# Patient Record
Sex: Male | Born: 1945 | Race: Black or African American | Hispanic: No | Smoking: Current every day smoker
Health system: Southern US, Community
[De-identification: ages and names within clinical notes are randomized; demographics above are authoritative.]

## PROBLEM LIST (undated history)

## (undated) DIAGNOSIS — R159 Full incontinence of feces: Secondary | ICD-10-CM

## (undated) DIAGNOSIS — M1990 Unspecified osteoarthritis, unspecified site: Secondary | ICD-10-CM

## (undated) DIAGNOSIS — I10 Essential (primary) hypertension: Secondary | ICD-10-CM

## (undated) DIAGNOSIS — Z87442 Personal history of urinary calculi: Secondary | ICD-10-CM

## (undated) DIAGNOSIS — I739 Peripheral vascular disease, unspecified: Secondary | ICD-10-CM

## (undated) DIAGNOSIS — C801 Malignant (primary) neoplasm, unspecified: Secondary | ICD-10-CM

---

## 1993-04-06 DIAGNOSIS — Z87828 Personal history of other (healed) physical injury and trauma: Secondary | ICD-10-CM | POA: Insufficient documentation

## 1995-04-07 HISTORY — PX: ABOVE KNEE LEG AMPUTATION: SUR20

## 2002-04-06 DIAGNOSIS — I739 Peripheral vascular disease, unspecified: Secondary | ICD-10-CM | POA: Insufficient documentation

## 2011-05-12 DIAGNOSIS — F17200 Nicotine dependence, unspecified, uncomplicated: Secondary | ICD-10-CM | POA: Insufficient documentation

## 2011-05-12 DIAGNOSIS — I10 Essential (primary) hypertension: Secondary | ICD-10-CM | POA: Insufficient documentation

## 2011-05-12 DIAGNOSIS — E785 Hyperlipidemia, unspecified: Secondary | ICD-10-CM | POA: Insufficient documentation

## 2014-12-18 DIAGNOSIS — Z9189 Other specified personal risk factors, not elsewhere classified: Secondary | ICD-10-CM | POA: Insufficient documentation

## 2015-02-06 DIAGNOSIS — R296 Repeated falls: Secondary | ICD-10-CM | POA: Insufficient documentation

## 2015-02-17 ENCOUNTER — Emergency Department (HOSPITAL_COMMUNITY): Payer: Medicare Other

## 2015-02-17 ENCOUNTER — Encounter (HOSPITAL_COMMUNITY): Payer: Self-pay

## 2015-02-17 ENCOUNTER — Emergency Department (HOSPITAL_COMMUNITY)
Admission: EM | Admit: 2015-02-17 | Discharge: 2015-02-17 | Disposition: A | Discharge status: Home / Self Care | Payer: Medicare Other | Attending: Emergency Medicine | Admitting: Emergency Medicine

## 2015-02-17 ENCOUNTER — Encounter (HOSPITAL_COMMUNITY): Payer: Self-pay | Admitting: *Deleted

## 2015-02-17 ENCOUNTER — Emergency Department (INDEPENDENT_AMBULATORY_CARE_PROVIDER_SITE_OTHER)
Admission: EM | Admit: 2015-02-17 | Discharge: 2015-02-17 | Disposition: A | Discharge status: Other Acute Inpatient Hospital | Payer: Medicare Other | Source: Home / Self Care | Attending: Family Medicine | Admitting: Family Medicine

## 2015-02-17 DIAGNOSIS — Z79899 Other long term (current) drug therapy: Secondary | ICD-10-CM | POA: Insufficient documentation

## 2015-02-17 DIAGNOSIS — I10 Essential (primary) hypertension: Secondary | ICD-10-CM | POA: Insufficient documentation

## 2015-02-17 DIAGNOSIS — M79675 Pain in left toe(s): Secondary | ICD-10-CM | POA: Diagnosis present

## 2015-02-17 DIAGNOSIS — Z7982 Long term (current) use of aspirin: Secondary | ICD-10-CM | POA: Diagnosis not present

## 2015-02-17 DIAGNOSIS — G8929 Other chronic pain: Secondary | ICD-10-CM

## 2015-02-17 DIAGNOSIS — Z880 Allergy status to penicillin: Secondary | ICD-10-CM | POA: Insufficient documentation

## 2015-02-17 DIAGNOSIS — F17200 Nicotine dependence, unspecified, uncomplicated: Secondary | ICD-10-CM | POA: Insufficient documentation

## 2015-02-17 DIAGNOSIS — Z8679 Personal history of other diseases of the circulatory system: Secondary | ICD-10-CM | POA: Diagnosis not present

## 2015-02-17 DIAGNOSIS — M869 Osteomyelitis, unspecified: Secondary | ICD-10-CM | POA: Diagnosis not present

## 2015-02-17 HISTORY — DX: Peripheral vascular disease, unspecified: I73.9

## 2015-02-17 HISTORY — DX: Essential (primary) hypertension: I10

## 2015-02-17 MED ORDER — SULFAMETHOXAZOLE-TRIMETHOPRIM 800-160 MG PO TABS
1.0000 | ORAL_TABLET | Freq: Once | ORAL | Status: AC
  Administered 2015-02-17: 1 via ORAL
  Filled 2015-02-17: qty 1

## 2015-02-17 MED ORDER — RIFAMPIN 300 MG PO CAPS: 300.0000 mg | ORAL_CAPSULE | Freq: Two times a day (BID) | ORAL | Status: DC

## 2015-02-17 MED ORDER — SULFAMETHOXAZOLE-TRIMETHOPRIM 800-160 MG PO TABS: 1.0000 | ORAL_TABLET | Freq: Two times a day (BID) | ORAL | Status: DC

## 2015-02-17 NOTE — ED Provider Notes (Signed)
CSN: [id number]     Arrival date & time 02/17/15  2041 History   First MD Initiated Contact with Patient 02/17/15 2118     Chief Complaint  Patient presents with  . Toe Pain    HPI  Patient presents with concern of left toe pain. Toe has been painful for a long time, worse over the past. Patient saw podiatry a few days ago, had an injection, though he is unsure of what. Subsequent, the patient developed swelling, and eventually discharged from the toe. Prior to my evaluation notes that the toe had production of substantial amounts of pus, spontaneously. Patient went to urgent care tonight, was sent here for evaluation. He denies any other complaints, including fever, chills, lightheadedness, nausea, vomiting, loss of sensation in the foot. He has multiple medical issues, he acknowledges, including hypertension, peripheral vascular disease. He is scheduled to see vascular surgery in 4 days.   Past Medical History  Diagnosis Date  . Hypertension   . Peripheral artery disease (HCC)    History reviewed. No pertinent past surgical history. History reviewed. No pertinent family history. Social History  Substance Use Topics  . Smoking status: Current Every Day Smoker  . Smokeless tobacco: None  . Alcohol Use: Yes    Review of Systems  Constitutional:       Per HPI, otherwise negative  HENT:       Per HPI, otherwise negative  Respiratory:       Per HPI, otherwise negative  Cardiovascular:       Per HPI, otherwise negative  Gastrointestinal: Negative for vomiting.  Endocrine:       Negative aside from HPI  Genitourinary:       Neg aside from HPI   Musculoskeletal:       Per HPI, otherwise negative  Skin: Positive for color change and wound.  Neurological: Negative for syncope.      Allergies  Orange fruit and Penicillins  Home Medications   Prior to Admission medications   Medication Sig Start Date End Date Taking? Authorizing Provider  amLODipine (NORVASC) 5 MG  tablet Take 5 mg by mouth daily.   Yes Historical Provider, MD  aspirin EC 81 MG tablet Take 81 mg by mouth every 14 (fourteen) days.   Yes Historical Provider, MD  atorvastatin (LIPITOR) 40 MG tablet Take 40 mg by mouth daily.   Yes Historical Provider, MD  ibuprofen (ADVIL,MOTRIN) 200 MG tablet Take 200 mg by mouth every 6 (six) hours as needed (pain).   Yes Historical Provider, MD  OVER THE COUNTER MEDICATION Take 1 capsule by mouth daily. Beta Prostate   Yes Historical Provider, MD  sildenafil (VIAGRA) 50 MG tablet Take 50 mg by mouth daily as needed for erectile dysfunction.   Yes Historical Provider, MD  rifampin (RIFADIN) 300 MG capsule Take 1 capsule (300 mg total) by mouth 2 (two) times daily. 02/17/15   Gerhard Munch, MD  sulfamethoxazole-trimethoprim (BACTRIM DS,SEPTRA DS) 800-160 MG tablet Take 1 tablet by mouth 2 (two) times daily. 02/17/15   Gerhard Munch, MD   BP 130/72 mmHg  Pulse 86  Temp(Src) 97.8 F (36.6 C) (Oral)  Resp 16  SpO2 100% Physical Exam  Constitutional: He is oriented to person, place, and time. He appears well-developed. No distress.  HENT:  Head: Normocephalic and atraumatic.  Eyes: Conjunctivae and EOM are normal.  Cardiovascular: Normal rate and regular rhythm.   Palpable posterior tibial, and dorsalis pedis pulses, left foot, Refill is normal  Pulmonary/Chest:  Effort normal. No stridor. No respiratory distress.  Abdominal: He exhibits no distension.  Musculoskeletal: He exhibits no edema.  Neurological: He is alert and oriented to person, place, and time.  Skin: Skin is warm and dry.     Skin is sclerotic, patient notes that this is normal, unchanged.  Psychiatric: He has a normal mood and affect.  Nursing note and vitals reviewed.   ED Course  Procedures (including critical care time) Labs Review Labs Reviewed - No data to display  Imaging Review Dg Toe 5th Left  02/17/2015  CLINICAL DATA:  Pain at the right fifth toe, acute onset.  Initial encounter. EXAM: DG TOE 5TH LEFT COMPARISON:  None. FINDINGS: There is near complete absence of the fifth distal phalanx, suspicious for underlying osteomyelitis. The fifth proximal phalanx is grossly unremarkable, though difficult to fully characterize on radiograph. Surrounding soft tissue swelling is noted. Remaining visualized joint spaces are preserved. IMPRESSION: Near complete absence of the fifth distal phalanx, suspicious for osteomyelitis. The fifth proximal phalanx is grossly unremarkable in appearance, though difficult to fully assess on radiograph. Electronically Signed   By: Roanna RaiderJeffery  Chang M.D.   On: 02/17/2015 22:35   I have personally reviewed and evaluated these images and lab results as part of my medical decision-making.  I also reviewed the patient's notes from urgent care earlier tonight. I had a lengthy conversation patient and multiple family members about his likely infection, need to keep his vascular surgery appointment in 4 days, need for medication compliance.   MDM   Final diagnoses:  Osteomyelitis of toe (HCC)   Patient presents with ongoing pain, swelling in the left toe, fifth. Patient has evidence for chronic infection, no evidence for bacteremia or sepsis. Given the spontaneous drainage, there is low suspicion for deep space infection. Patient started on course of antibiotics, will follow-up as scheduled w vascular surgery.  Gerhard Munchobert Hurshell Dino, MD 02/17/15 [phone]

## 2015-02-17 NOTE — ED Notes (Signed)
Pt  Has  Poor  Circulation    He  Has  An appt in  4  Days  With a  Vascular  Surgeon   At  [facility]        He  Saw a   Podiatrist       3  Days  Ago     And  Had   An  Injection      In  The   l  Small  Toe     He    Reports  Pain   On     Palpation

## 2015-02-17 NOTE — ED Notes (Signed)
Pt off unit with xray 

## 2015-02-17 NOTE — Discharge Instructions (Signed)
As discussed, it is very important that you take all medication as directed, monitor your condition carefully, and be sure to follow-[facility] in 4 days.    Be sure to return here, or to another emergency department if you develop any new, or concerning changes in your condition.

## 2015-02-17 NOTE — ED Provider Notes (Addendum)
CSN: [id number]     Arrival date & time 02/17/15  1920 History   First MD Initiated Contact with Patient 02/17/15 1950     Chief Complaint  Patient presents with  . Toe Pain   (Consider location/radiation/quality/duration/timing/severity/associated sxs/prior Treatment) Patient is a 69 y.o. male presenting with toe pain. The history is provided by the patient and a relative.  Toe Pain This is a recurrent problem. The current episode started more than 1 week ago. The problem has been gradually worsening. Associated symptoms comments: Has had stents in left leg recently for vasc insuff with [facility] eval on fri this week but pt with chronic left foot and 5th toe swelling , recent podiatrist injection but today toe pain and sts spont drained with purulent fluid and sx relief of pain..    Past Medical History  Diagnosis Date  . Hypertension   . Peripheral artery disease (HCC)    History reviewed. No pertinent past surgical history. History reviewed. No pertinent family history. Social History  Substance Use Topics  . Smoking status: Current Every Day Smoker  . Smokeless tobacco: None  . Alcohol Use: Yes    Review of Systems  Constitutional: Negative.   Musculoskeletal: Positive for joint swelling and gait problem.  Skin: Positive for wound.  All other systems reviewed and are negative.   Allergies  Orange fruit and Penicillins  Home Medications   Prior to Admission medications   Medication Sig Start Date End Date Taking? Authorizing Provider  amLODipine (NORVASC) 5 MG tablet Take 5 mg by mouth daily.   Yes Historical Provider, MD  aspirin EC 81 MG tablet Take 81 mg by mouth every 14 (fourteen) days.    Historical Provider, MD  atorvastatin (LIPITOR) 40 MG tablet Take 40 mg by mouth daily.    Historical Provider, MD  ibuprofen (ADVIL,MOTRIN) 200 MG tablet Take 200 mg by mouth every 6 (six) hours as needed (pain).    Historical Provider, MD  OVER THE COUNTER MEDICATION Take 1  capsule by mouth daily. Beta Prostate    Historical Provider, MD  sildenafil (VIAGRA) 50 MG tablet Take 50 mg by mouth daily as needed for erectile dysfunction.    Historical Provider, MD  rifampin (RIFADIN) 300 MG capsule Take 1 capsule (300 mg total) by mouth 2 (two) times daily. 02/17/15   Gerhard Munchobert Lockwood, MD  sulfamethoxazole-trimethoprim (BACTRIM DS,SEPTRA DS) 800-160 MG tablet Take 1 tablet by mouth 2 (two) times daily. 02/17/15   Gerhard Munchobert Lockwood, MD   Meds Ordered and Administered this Visit  Medications - No data to display  BP 134/92 mmHg  Pulse 96  Temp(Src) 98.7 F (37.1 C) (Oral)  SpO2 96% No data found.   Physical Exam  Constitutional: He is oriented to person, place, and time. He appears well-developed and well-nourished. No distress.  Neurological: He is alert and oriented to person, place, and time.  Skin: Skin is warm and dry. There is erythema.  Open draining lesion on tip of left 5th toe, dark and ischemic appearing.  Nursing note and vitals reviewed.   ED Course  Procedures (including critical care time)  Labs Review Labs Reviewed - No data to display  Imaging Review Dg Toe 5th Left  02/17/2015  CLINICAL DATA:  Pain at the right fifth toe, acute onset. Initial encounter. EXAM: DG TOE 5TH LEFT COMPARISON:  None. FINDINGS: There is near complete absence of the fifth distal phalanx, suspicious for underlying osteomyelitis. The fifth proximal phalanx is grossly unremarkable, though difficult  to fully characterize on radiograph. Surrounding soft tissue swelling is noted. Remaining visualized joint spaces are preserved. IMPRESSION: Near complete absence of the fifth distal phalanx, suspicious for osteomyelitis. The fifth proximal phalanx is grossly unremarkable in appearance, though difficult to fully assess on radiograph. Electronically Signed   By: Roanna Raider M.D.   On: 02/17/2015 22:35     Visual Acuity Review  Right Eye Distance:   Left Eye Distance:    Bilateral Distance:    Right Eye Near:   Left Eye Near:    Bilateral Near:         MDM   1. Chronic toe pain, left foot    Sent for likely eval of osteomyelitis left 5th toe.    Linna Hoff, MD 02/17/15 2020  Linna Hoff, MD 02/18/15 [phone]

## 2015-02-17 NOTE — ED Notes (Signed)
To ed from u/c.  Recurrent left small toe pain when touching.  Recent podiatrist injection, pt not sure what was injected.  [facility] stens in left leg.l  Toe drained pus  spontaneously today.

## 2015-12-31 DIAGNOSIS — C61 Malignant neoplasm of prostate: Secondary | ICD-10-CM | POA: Insufficient documentation

## 2016-05-03 IMAGING — DX DG TOE 5TH 2+V*L*
3 series · 3 of 3 positions shown · non-contrast
Comparison: None.

CLINICAL DATA: Pain at the right fifth toe, acute onset. Initial
encounter.

EXAM:
DG TOE 5TH LEFT

[toe ap]
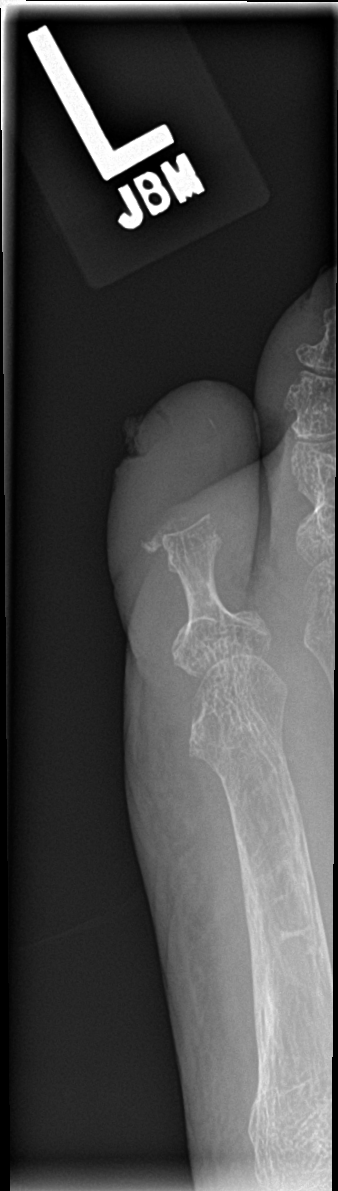

[toe obl]
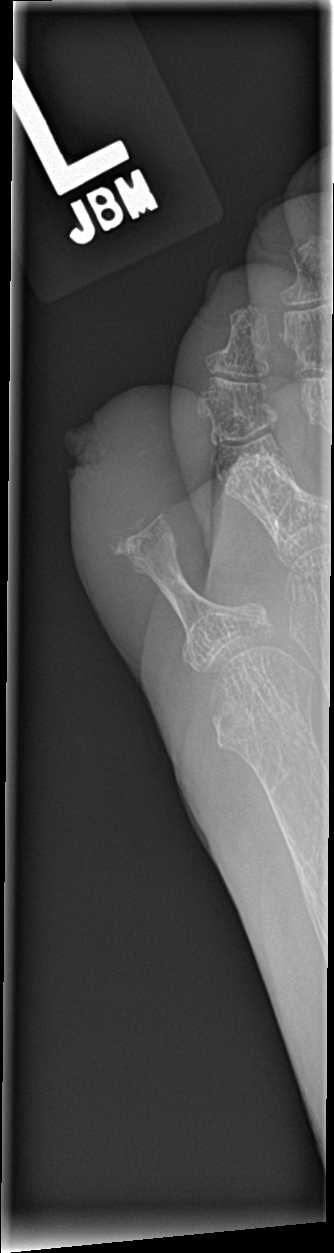

[toe lat]
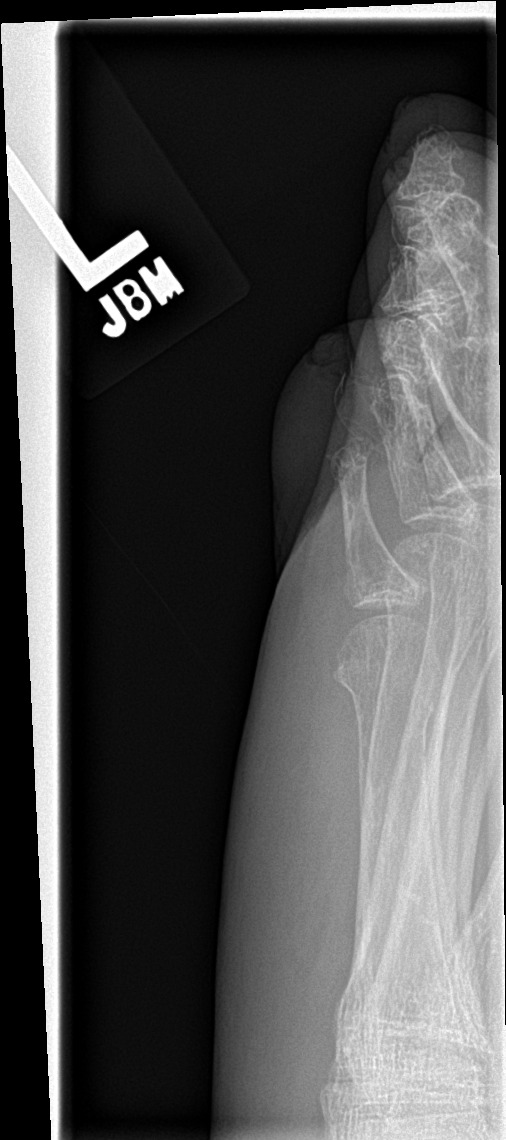

[3 of 3 positions shown; findings below may reference images not displayed]

FINDINGS: There is near complete absence of the fifth distal phalanx,
suspicious for underlying osteomyelitis. The fifth proximal phalanx
is grossly unremarkable, though difficult to fully characterize on
radiograph. Surrounding soft tissue swelling is noted. Remaining
visualized joint spaces are preserved.
IMPRESSION: Near complete absence of the fifth distal phalanx, suspicious for
osteomyelitis. The fifth proximal phalanx is grossly unremarkable in
appearance, though difficult to fully assess on radiograph.

## 2016-09-15 DIAGNOSIS — N521 Erectile dysfunction due to diseases classified elsewhere: Secondary | ICD-10-CM | POA: Insufficient documentation

## 2018-09-23 DIAGNOSIS — Z89612 Acquired absence of left leg above knee: Secondary | ICD-10-CM | POA: Insufficient documentation

## 2021-04-06 HISTORY — PX: ABOVE KNEE LEG AMPUTATION: SUR20

## 2022-03-17 ENCOUNTER — Ambulatory Visit: Payer: Self-pay | Admitting: Family

## 2022-09-10 ENCOUNTER — Ambulatory Visit: Payer: Medicare Other | Admitting: Podiatry

## 2022-10-05 NOTE — H&P (View-Only) (Signed)
 [facility] [place]  ASSESSMENT / PLAN: Steven Sharp is a 77 y.o. male with atherosclerosis of native arteries of right lower extremity causing ulceration.  Patient counseled patients with chronic limb threatening ischemia have an annual risk of cardiovascular mortality of 25% and a high risk of amputation.   WIfI score calculated based on clinical exam and non-invasive measurements. 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  Recommend:  Abstinence from all tobacco products. Blood glucose control with goal A1c < 7%. Blood pressure control with goal blood pressure < 140/90 mmHg. Lipid reduction therapy with goal LDL-C <70 mg/dL.  Aspirin 81mg  PO QD.  Atorvastatin 40-80mg  PO QD (or other "high intensity" statin therapy).  The patient is on best medical therapy for peripheral arterial disease. The patient has been counseled about the risks of tobacco use in atherosclerotic disease. The patient has been counseled to abstain from any tobacco use. An aortogram with bilateral lower extremity runoff angiography and Right lower extremity intervention and is indicated to better evaluate the patient's lower extremity circulation because of the limb threatening nature of the patient's diagnosis. Based on the patient's clinical exam and non-invasive data, we anticipate an endovascular intervention in the iliac and femoropopliteal vessels. Stenting would be favored because of the improved primary patency of these interventions as compared to plain balloon angioplasty.  CHIEF COMPLAINT: right foot ulceration  HISTORY OF PRESENT ILLNESS: Steven Sharp is a 77 y.o. male who presents to clinic for evaluation of right foot ulceration.  He previously establish care with [facility], but this system is out of his insurance network; and so he presents today for evaluation.  He is a complicated past vascular history before mostly at the [facility] [place].  He has a remote history of left  above-knee amputation.  He manages very well with this and transfers with his right lower extremity.  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  He is referred for further management.  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  No family history on file.  Social History   Socioeconomic History   Marital status: Single    Spouse name: Not on file   Number of children: Not on file   Years of education: Not on file   Highest education level: Not on file  Occupational History   Not on file  Tobacco Use   Smoking status: Every Day   Smokeless tobacco: Not on file  Substance and Sexual Activity   Alcohol use: Yes   Drug use: Not on file   Sexual activity: Not on file  Other Topics Concern   Not on file  Social History Narrative   Not on file   Social Determinants of Health   Financial Resource Strain: Not on file  Food Insecurity: Not on file  Transportation Needs: Not on file  Physical Activity: Not on file  Stress: Not on file  Social Connections: Not on file  Intimate Partner Violence: Not on file    Allergies  Allergen Reactions   Orange Fruit [Citrus]  Hives and Itching   Penicillins Hives and Itching    Has patient had a PCN reaction causing immediate rash, facial/tongue/throat swelling, SOB or lightheadedness with hypotension: Yes Has patient had a PCN reaction causing severe rash involving mucus membranes or skin necrosis: No Has patient had a PCN reaction that required hospitalization No Has patient had a PCN reaction occurring within the last  10 years: No If all of the above answers are "NO", then may proceed with Cephalosporin use.    Current Outpatient Medications  Medication Sig Dispense Refill   amLODipine (NORVASC) 5 MG tablet Take 5 mg by mouth daily.     aspirin EC 81 MG tablet Take 81 mg by mouth every 14 (fourteen) days.     atorvastatin (LIPITOR) 40 MG tablet Take 40 mg by mouth daily.     ibuprofen (ADVIL,MOTRIN) 200 MG tablet Take 200 mg by mouth every 6 (six) hours as needed (pain).     OVER THE COUNTER MEDICATION Take 1 capsule by mouth daily. Beta Prostate     rifampin (RIFADIN) 300 MG capsule Take 1 capsule (300 mg total) by mouth 2 (two) times daily. 20 capsule 0   sildenafil (VIAGRA) 50 MG tablet Take 50 mg by mouth daily as needed for erectile dysfunction.     sulfamethoxazole-trimethoprim (BACTRIM DS,SEPTRA DS) 800-160 MG tablet Take 1 tablet by mouth 2 (two) times daily. 20 tablet 0   No current facility-administered medications for this visit.    PHYSICAL EXAM Vitals:   10/06/22 0951  BP: (!) 153/76  Pulse: 88  Resp: 20  Temp: 98.2 F (36.8 C)  SpO2: 93%  Height: 5\' 6"  (1.676 m)   Chronically ill elderly man.  In a wheelchair.  Left above-knee amputation.  Regular rate and rhythm.  Unlabored breathing.  Right second toe ulcer approximately 2 x 2 cm.  No exterior signs of infection.  No palpable pedal pulses.  PERTINENT LABORATORY AND RADIOLOGIC DATA Outside ABI   Ankle Dorsalis Pedis 78 0.48 Monophasic Ankle Posterior Tibial 72 0.44 Monophasic Digit 1 22 0.13 Reduced    Outside duplex Occlusion of the superficial femoral and popliteal arteries. Patent common femoral and profunda arteries by duplex. Fem fem bypass graft occluded per duplex.   Rande Brunt. Lenell Antu, MD FACS [facility] Phone Number: [phone] 10/05/2022 9:23 AM   Total time spent on preparing this encounter including chart review, data review, collecting history, examining the  patient, coordinating care for this new patient, 60 minutes.  Portions of this report may have been transcribed using voice recognition software.  Every effort has been made to ensure accuracy; however, inadvertent computerized transcription errors may still be present.

## 2022-10-05 NOTE — H&P (View-Only) (Signed)
[facility] [place]  ASSESSMENT / PLAN: Steven Sharp is a 77 y.o. male with atherosclerosis of native arteries of right lower extremity causing ulceration.  Patient counseled patients with chronic limb threatening ischemia have an annual risk of cardiovascular mortality of 25% and a high risk of amputation.   WIfI score calculated based on clinical exam and non-invasive measurements. 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  Recommend:  Abstinence from all tobacco products. Blood glucose control with goal A1c < 7%. Blood pressure control with goal blood pressure < 140/90 mmHg. Lipid reduction therapy with goal LDL-C <70 mg/dL.  Aspirin 81mg  PO QD.  Atorvastatin 40-80mg  PO QD (or other "high intensity" statin therapy).  The patient is on best medical therapy for peripheral arterial disease. The patient has been counseled about the risks of tobacco use in atherosclerotic disease. The patient has been counseled to abstain from any tobacco use. An aortogram with bilateral lower extremity runoff angiography and Right lower extremity intervention and is indicated to better evaluate the patient's lower extremity circulation because of the limb threatening nature of the patient's diagnosis. Based on the patient's clinical exam and non-invasive data, we anticipate an endovascular intervention in the iliac and femoropopliteal vessels. Stenting would be favored because of the improved primary patency of these interventions as compared to plain balloon angioplasty.  CHIEF COMPLAINT: right foot ulceration  HISTORY OF PRESENT ILLNESS: Steven Sharp is a 77 y.o. male who presents to clinic for evaluation of right foot ulceration.  He previously establish care with [facility], but this system is out of his insurance network; and so he presents today for evaluation.  He is a complicated past vascular history before mostly at the [facility] [place].  He has a remote history of left  above-knee amputation.  He manages very well with this and transfers with his right lower extremity.  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  He is referred for further management.  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  No family history on file.  Social History   Socioeconomic History   Marital status: Single    Spouse name: Not on file   Number of children: Not on file   Years of education: Not on file   Highest education level: Not on file  Occupational History   Not on file  Tobacco Use   Smoking status: Every Day   Smokeless tobacco: Not on file  Substance and Sexual Activity   Alcohol use: Yes   Drug use: Not on file   Sexual activity: Not on file  Other Topics Concern   Not on file  Social History Narrative   Not on file   Social Determinants of Health   Financial Resource Strain: Not on file  Food Insecurity: Not on file  Transportation Needs: Not on file  Physical Activity: Not on file  Stress: Not on file  Social Connections: Not on file  Intimate Partner Violence: Not on file    Allergies  Allergen Reactions   Orange Fruit [Citrus]  Hives and Itching   Penicillins Hives and Itching    Has patient had a PCN reaction causing immediate rash, facial/tongue/throat swelling, SOB or lightheadedness with hypotension: Yes Has patient had a PCN reaction causing severe rash involving mucus membranes or skin necrosis: No Has patient had a PCN reaction that required hospitalization No Has patient had a PCN reaction occurring within the last  10 years: No If all of the above answers are "NO", then may proceed with Cephalosporin use.    Current Outpatient Medications  Medication Sig Dispense Refill   amLODipine (NORVASC) 5 MG tablet Take 5 mg by mouth daily.     aspirin EC 81 MG tablet Take 81 mg by mouth every 14 (fourteen) days.     atorvastatin (LIPITOR) 40 MG tablet Take 40 mg by mouth daily.     ibuprofen (ADVIL,MOTRIN) 200 MG tablet Take 200 mg by mouth every 6 (six) hours as needed (pain).     OVER THE COUNTER MEDICATION Take 1 capsule by mouth daily. Beta Prostate     rifampin (RIFADIN) 300 MG capsule Take 1 capsule (300 mg total) by mouth 2 (two) times daily. 20 capsule 0   sildenafil (VIAGRA) 50 MG tablet Take 50 mg by mouth daily as needed for erectile dysfunction.     sulfamethoxazole-trimethoprim (BACTRIM DS,SEPTRA DS) 800-160 MG tablet Take 1 tablet by mouth 2 (two) times daily. 20 tablet 0   No current facility-administered medications for this visit.    PHYSICAL EXAM Vitals:   10/06/22 0951  BP: (!) 153/76  Pulse: 88  Resp: 20  Temp: 98.2 F (36.8 C)  SpO2: 93%  Height: 5\' 6"  (1.676 m)   Chronically ill elderly man.  In a wheelchair.  Left above-knee amputation.  Regular rate and rhythm.  Unlabored breathing.  Right second toe ulcer approximately 2 x 2 cm.  No exterior signs of infection.  No palpable pedal pulses.  PERTINENT LABORATORY AND RADIOLOGIC DATA Outside ABI   Ankle Dorsalis Pedis 78 0.48 Monophasic Ankle Posterior Tibial 72 0.44 Monophasic Digit 1 22 0.13 Reduced    Outside duplex Occlusion of the superficial femoral and popliteal arteries. Patent common femoral and profunda arteries by duplex. Fem fem bypass graft occluded per duplex.   Rande Brunt. Lenell Antu, MD FACS [facility] Phone Number: [phone] 10/05/2022 9:23 AM   Total time spent on preparing this encounter including chart review, data review, collecting history, examining the  patient, coordinating care for this new patient, 60 minutes.  Portions of this report may have been transcribed using voice recognition software.  Every effort has been made to ensure accuracy; however, inadvertent computerized transcription errors may still be present.

## 2022-10-05 NOTE — Progress Notes (Unsigned)
VASC[facility]IN SPECIALISTS OF [place]  ASSESSMENT / PLAN: Steven Sharp is a 77 y.o. male with atherosclerosis of native arteries of right lower extremity causing ulceration.  Patient counseled patients with chronic limb threatening ischemia have an annual risk of cardiovascular mortality of 25% and a high risk of amputation.   WIfI score calculated based on clinical exam and non-invasive measurements. 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  Recommend:  Abstinence from all tobacco products. Blood glucose control with goal A1c < 7%. Blood pressure control with goal blood pressure < 140/90 mmHg. Lipid reduction therapy with goal LDL-C <70 mg/dL.  Aspirin 81mg  PO QD.  Atorvastatin 40-80mg  PO QD (or other "high intensity" statin therapy).  The patient is on best medical therapy for peripheral arterial disease. The patient has been counseled about the risks of tobacco use in atherosclerotic disease. The patient has been counseled to abstain from any tobacco use. An aortogram with bilateral lower extremity runoff angiography and Right lower extremity intervention and is indicated to better evaluate the patient's lower extremity circulation because of the limb threatening nature of the patient's diagnosis. Based on the patient's clinical exam and non-invasive data, we anticipate an endovascular intervention in the iliac and femoropopliteal vessels. Stenting would be favored because of the improved primary patency of these interventions as compared to plain balloon angioplasty.  CHIEF COMPLAINT: right foot ulceration  HISTORY OF PRESENT ILLNESS: Steven Sharp is a 77 y.o. male who presents to clinic for evaluation of right foot ulceration.  He previously establish care with [facility], but this system is out of his insurance network; and so he presents today for evaluation.  He is a complicated past vascular history before mostly at the [place] [facility] [place].  He has a remote history of left  above-knee amputation.  He manages very well with this and transfers with his right lower extremity.  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  He is referred for further management.  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  No family history on file.  Social History   Socioeconomic History   Marital status: Single    Spouse name: Not on file   Number of children: Not on file   Years of education: Not on file   Highest education level: Not on file  Occupational History   Not on file  Tobacco Use   Smoking status: Every Day   Smokeless tobacco: Not on file  Substance and Sexual Activity   Alcohol use: Yes   Drug use: Not on file   Sexual activity: Not on file  Other Topics Concern   Not on file  Social History Narrative   Not on file   Social Determinants of Health   Financial Resource Strain: Not on file  Food Insecurity: Not on file  Transportation Needs: Not on file  Physical Activity: Not on file  Stress: Not on file  Social Connections: Not on file  Intimate Partner Violence: Not on file    Allergies  Allergen Reactions   Orange Fruit [Citrus]  Hives and Itching   Penicillins Hives and Itching    Has patient had a PCN reaction causing immediate rash, facial/tongue/throat swelling, SOB or lightheadedness with hypotension: Yes Has patient had a PCN reaction causing severe rash involving mucus membranes or skin necrosis: No Has patient had a PCN reaction that required hospitalization No Has patient had a PCN reaction occurring within the last  10 years: No If all of the above answers are "NO", then may proceed with Cephalosporin use.    Current Outpatient Medications  Medication Sig Dispense Refill   amLODipine (NORVASC) 5 MG tablet Take 5 mg by mouth daily.     aspirin EC 81 MG tablet Take 81 mg by mouth every 14 (fourteen) days.     atorvastatin (LIPITOR) 40 MG tablet Take 40 mg by mouth daily.     ibuprofen (ADVIL,MOTRIN) 200 MG tablet Take 200 mg by mouth every 6 (six) hours as needed (pain).     OVER THE COUNTER MEDICATION Take 1 capsule by mouth daily. Beta Prostate     rifampin (RIFADIN) 300 MG capsule Take 1 capsule (300 mg total) by mouth 2 (two) times daily. 20 capsule 0   sildenafil (VIAGRA) 50 MG tablet Take 50 mg by mouth daily as needed for erectile dysfunction.     sulfamethoxazole-trimethoprim (BACTRIM DS,SEPTRA DS) 800-160 MG tablet Take 1 tablet by mouth 2 (two) times daily. 20 tablet 0   No current facility-administered medications for this visit.    PHYSICAL EXAM Vitals:   10/06/22 0951  BP: (!) 153/76  Pulse: 88  Resp: 20  Temp: 98.2 F (36.8 C)  SpO2: 93%  Height: 5\' 6"  (1.676 m)   Chronically ill elderly man.  In a wheelchair.  Left above-knee amputation.  Regular rate and rhythm.  Unlabored breathing.  Right second toe ulcer approximately 2 x 2 cm.  No exterior signs of infection.  No palpable pedal pulses.  PERTINENT LABORATORY AND RADIOLOGIC DATA Outside ABI   Ankle Dorsalis Pedis 78 0.48 Monophasic Ankle Posterior Tibial 72 0.44 Monophasic Digit 1 22 0.13 Reduced    Outside duplex Occlusion of the superficial femoral and popliteal arteries. Patent common femoral and profunda arteries by duplex. Fem fem bypass graft occluded per duplex.   Rande Brunt. Lenell Antu, MD FACS [facility] of [facility] Phone Number: [phone] 10/05/2022 9:23 AM   Total time spent on preparing this encounter including chart review, data review, collecting history, examining the  patient, coordinating care for this new patient, 60 minutes.  Portions of this report may have been transcribed using voice recognition software.  Every effort has been made to ensure accuracy; however, inadvertent computerized transcription errors may still be present.

## 2022-10-06 ENCOUNTER — Ambulatory Visit (INDEPENDENT_AMBULATORY_CARE_PROVIDER_SITE_OTHER): Payer: Medicare (Managed Care) | Admitting: Vascular Surgery

## 2022-10-06 ENCOUNTER — Encounter: Payer: Self-pay | Admitting: Vascular Surgery

## 2022-10-06 VITALS — BP 153/76 | HR 88 | Temp 98.2°F | Resp 20 | Ht 66.0 in

## 2022-10-06 DIAGNOSIS — I70235 Atherosclerosis of native arteries of right leg with ulceration of other part of foot: Secondary | ICD-10-CM | POA: Diagnosis not present

## 2022-10-09 ENCOUNTER — Telehealth: Payer: Self-pay

## 2022-10-09 NOTE — Telephone Encounter (Signed)
Left VM for Steven Sharp at [facility], to schedule patient for aortogram.

## 2022-10-12 NOTE — Telephone Encounter (Addendum)
Left VM for Jessica at [facility] to return call.

## 2022-10-13 ENCOUNTER — Other Ambulatory Visit: Payer: Self-pay

## 2022-10-13 DIAGNOSIS — I70235 Atherosclerosis of native arteries of right leg with ulceration of other part of foot: Secondary | ICD-10-CM

## 2022-10-23 ENCOUNTER — Ambulatory Visit (HOSPITAL_BASED_OUTPATIENT_CLINIC_OR_DEPARTMENT_OTHER): Payer: Medicare (Managed Care)

## 2022-10-23 ENCOUNTER — Ambulatory Visit (HOSPITAL_COMMUNITY)
Admission: RE | Admit: 2022-10-23 | Discharge: 2022-10-23 | Disposition: A | Discharge status: Home / Self Care | Payer: Medicare (Managed Care) | Attending: Vascular Surgery | Admitting: Vascular Surgery

## 2022-10-23 ENCOUNTER — Ambulatory Visit (HOSPITAL_COMMUNITY)
Admission: RE | Disposition: A | Discharge status: Home / Self Care | Payer: Self-pay | Source: Home / Self Care | Attending: Vascular Surgery

## 2022-10-23 ENCOUNTER — Other Ambulatory Visit: Payer: Self-pay

## 2022-10-23 DIAGNOSIS — I70235 Atherosclerosis of native arteries of right leg with ulceration of other part of foot: Secondary | ICD-10-CM | POA: Diagnosis not present

## 2022-10-23 DIAGNOSIS — L97509 Non-pressure chronic ulcer of other part of unspecified foot with unspecified severity: Secondary | ICD-10-CM | POA: Diagnosis not present

## 2022-10-23 DIAGNOSIS — Z89612 Acquired absence of left leg above knee: Secondary | ICD-10-CM | POA: Insufficient documentation

## 2022-10-23 DIAGNOSIS — L97519 Non-pressure chronic ulcer of other part of right foot with unspecified severity: Secondary | ICD-10-CM | POA: Diagnosis not present

## 2022-10-23 HISTORY — PX: ABDOMINAL AORTOGRAM W/LOWER EXTREMITY: CATH118223

## 2022-10-23 LAB — POCT I-STAT, CHEM 8
BUN: 12 mg/dL (ref 8–23)
Calcium, Ion: 1.15 mmol/L (ref 1.15–1.40)
Chloride: 99 mmol/L (ref 98–111)
Creatinine, Ser: 0.9 mg/dL (ref 0.61–1.24)
Glucose, Bld: 91 mg/dL (ref 70–99)
HCT: 39 % (ref 39.0–52.0)
Hemoglobin: 13.3 g/dL (ref 13.0–17.0)
Potassium: 4.1 mmol/L (ref 3.5–5.1)
Sodium: 134 mmol/L — ABNORMAL LOW (ref 135–145)
TCO2: 26 mmol/L (ref 22–32)

## 2022-10-23 LAB — GLUCOSE, CAPILLARY: Glucose-Capillary: 96 mg/dL (ref 70–99)

## 2022-10-23 SURGERY — ABDOMINAL AORTOGRAM W/LOWER EXTREMITY
Anesthesia: LOCAL

## 2022-10-23 MED ORDER — ACETAMINOPHEN 325 MG PO TABS: 650.0000 mg | ORAL_TABLET | ORAL | Status: DC | PRN

## 2022-10-23 MED ORDER — PROTAMINE SULFATE 10 MG/ML IV SOLN
INTRAVENOUS | Status: AC
  Filled 2022-10-23: qty 5

## 2022-10-23 MED ORDER — CLOPIDOGREL BISULFATE 300 MG PO TABS
ORAL_TABLET | ORAL | Status: AC
  Filled 2022-10-23: qty 1

## 2022-10-23 MED ORDER — HEPARIN (PORCINE) IN NACL 1000-0.9 UT/500ML-% IV SOLN
INTRAVENOUS | Status: DC | PRN
  Administered 2022-10-23 (×2): 500 mL

## 2022-10-23 MED ORDER — SODIUM CHLORIDE 0.9% FLUSH: 3.0000 mL | INTRAVENOUS | Status: DC | PRN

## 2022-10-23 MED ORDER — FENTANYL CITRATE (PF) 100 MCG/2ML IJ SOLN
INTRAMUSCULAR | Status: AC
  Filled 2022-10-23: qty 2

## 2022-10-23 MED ORDER — SODIUM CHLORIDE 0.9 % IV SOLN: 250.0000 mL | INTRAVENOUS | Status: DC | PRN

## 2022-10-23 MED ORDER — MIDAZOLAM HCL 2 MG/2ML IJ SOLN
INTRAMUSCULAR | Status: AC
  Filled 2022-10-23: qty 2

## 2022-10-23 MED ORDER — ONDANSETRON HCL 4 MG/2ML IJ SOLN: 4.0000 mg | Freq: Four times a day (QID) | INTRAMUSCULAR | Status: DC | PRN

## 2022-10-23 MED ORDER — SODIUM CHLORIDE 0.9% FLUSH: 3.0000 mL | Freq: Two times a day (BID) | INTRAVENOUS | Status: DC

## 2022-10-23 MED ORDER — SODIUM CHLORIDE 0.9 % WEIGHT BASED INFUSION: 1.0000 mL/kg/h | INTRAVENOUS | Status: DC

## 2022-10-23 MED ORDER — HEPARIN SODIUM (PORCINE) 1000 UNIT/ML IJ SOLN
INTRAMUSCULAR | Status: AC
  Filled 2022-10-23: qty 10

## 2022-10-23 MED ORDER — LIDOCAINE HCL (PF) 1 % IJ SOLN
INTRAMUSCULAR | Status: AC
  Filled 2022-10-23: qty 30

## 2022-10-23 MED ORDER — SODIUM CHLORIDE 0.9 % IV SOLN: INTRAVENOUS | Status: DC

## 2022-10-23 MED ORDER — IODIXANOL 320 MG/ML IV SOLN
INTRAVENOUS | Status: DC | PRN
  Administered 2022-10-23: 30 mL via INTRA_ARTERIAL

## 2022-10-23 MED ORDER — LIDOCAINE HCL (PF) 1 % IJ SOLN
INTRAMUSCULAR | Status: DC | PRN
  Administered 2022-10-23: 10 mL
  Administered 2022-10-23: 15 mL

## 2022-10-23 MED ORDER — LABETALOL HCL 5 MG/ML IV SOLN: 10.0000 mg | INTRAVENOUS | Status: DC | PRN

## 2022-10-23 MED ORDER — ASPIRIN 81 MG PO CHEW
CHEWABLE_TABLET | ORAL | Status: AC
  Filled 2022-10-23: qty 1

## 2022-10-23 MED ORDER — HYDRALAZINE HCL 20 MG/ML IJ SOLN: 5.0000 mg | INTRAMUSCULAR | Status: DC | PRN

## 2022-10-23 SURGICAL SUPPLY — 17 items
CATH OMNI FLUSH 5F 65CM (CATHETERS) IMPLANT
COVER DOME SNAP 22 D (MISCELLANEOUS) IMPLANT
KIT MICROPUNCTURE NIT STIFF (SHEATH) IMPLANT
KIT PV (KITS) ×1 IMPLANT
KIT SINGLE USE MANIFOLD (KITS) IMPLANT
KIT SYRINGE INJ CVI SPIKEX1 (MISCELLANEOUS) IMPLANT
PROTECTION STATION PRESSURIZED (MISCELLANEOUS) ×1
SET ATX-X65L (MISCELLANEOUS) IMPLANT
SHEATH PINNACLE 5F 10CM (SHEATH) IMPLANT
SHEATH PROBE COVER 6X72 (BAG) IMPLANT
STATION PROTECTION PRESSURIZED (MISCELLANEOUS) IMPLANT
STOPCOCK MORSE 400PSI 3WAY (MISCELLANEOUS) IMPLANT
SYR MEDRAD MARK 7 150ML (SYRINGE) ×1 IMPLANT
TRANSDUCER W/STOPCOCK (MISCELLANEOUS) ×1 IMPLANT
TRAY PV CATH (CUSTOM PROCEDURE TRAY) ×1 IMPLANT
TUBING CIL FLEX 10 FLL-RA (TUBING) IMPLANT
WIRE BENTSON .035X145CM (WIRE) IMPLANT

## 2022-10-23 NOTE — Progress Notes (Signed)
Site area: right groin a 5 french arterial sheath was removed  Site Prior to Removal:  Level 0  Pressure Applied For 20 MINUTES    Bedrest Beginning at 1315pm  Manual:   Yes.    Patient Status During Pull:  stable  Post Pull Groin Site:  Level 0  Post Pull Instructions Given:  Yes.    Post Pull Pulses Present:  Yes.    Dressing Applied:  Yes.    Comments:

## 2022-10-23 NOTE — Progress Notes (Signed)
While dressing pt for DC he complained of smelling bad. I informed him that we did full peri care front and back to place a male pure wick and at that time he had stool on his bottom, pt was cleaned by Marcelino Duster CNA and myself.. He said his daughter is in the health field and will be upset we left him smelling bad. I asked the pt if he would like a wash clothe he said yes, Maureen Ralphs RN washed his back and he was satisfied. His odor has been intense throughout his stay, the bed is black with dirt and flaking skin, when I removed the IV the tegaderm was black with dirt. The nurse/ Tanya RN inserting the Iv stated she had to wash his arm 4-5 times before inserting IV due to dirt.  Pt has been agitated and  demanding most of his stay. Peri area and buttock had mostly healed skin with a white salve residue intact. He stated his buttock is sore and skin is getting ready to open again, pt was handled with as much care, dignity and cleanliness as time allowed. He was in need of a full bath.

## 2022-10-23 NOTE — Op Note (Signed)
DATE OF SERVICE: 10/23/2022  PATIENT:  Steven Sharp  77 y.o. male  PRE-OPERATIVE DIAGNOSIS:  Atherosclerosis of native arteries of right lower extremity causing ulceration  POST-OPERATIVE DIAGNOSIS:  Same  PROCEDURE:   1) Ultrasound guided right common femoral artery access 2) Right lower extremity angiogram   SURGEON:  Rande Brunt. Lenell Antu, MD  ASSISTANT: none  ANESTHESIA:   local  ESTIMATED BLOOD LOSS: minimal  LOCAL MEDICATIONS USED:  LIDOCAINE   COUNTS: confirmed correct.  PATIENT DISPOSITION:  PACU - hemodynamically stable.   Delay start of Pharmacological VTE agent (>24hrs) due to surgical blood loss or risk of bleeding: no  INDICATION FOR PROCEDURE: Steven Sharp is a 77 y.o. male with right foot ulceration. After careful discussion of risks, benefits, and alternatives the patient was offered angiography. The patient understood and wished to proceed.  OPERATIVE FINDINGS:   Right lower extremity:  Common iliac artery: stented artery patent to terminal aorta External iliac artery: patent Common femoral artery: patent  Profunda femoris artery: patent  Superficial femoral artery: occluded Popliteal artery: reconstitutes above the knee, but is diseased. Behind knee segment normal. Below knee popliteal artery appears healthy. Anterior tibial artery: dominant tibial, flows to foot Tibioperoneal trunk: patent Peroneal artery: patent to ankle Posterior tibial artery: occluded Pedal circulation: disadvantaged. Fills via AT.  DESCRIPTION OF PROCEDURE: After identification of the patient in the pre-operative holding area, the patient was transferred to the operating room. The patient was positioned supine on the operating room table. The groins was prepped and draped in standard fashion. A surgical pause was performed confirming correct patient, procedure, and operative location.  The right groin was anesthetized with subcutaneous injection of 1% lidocaine. Using  ultrasound guidance, the right common femoral artery was accessed with micropuncture technique. Fluoroscopy was used to confirm cannulation over the femoral head. Angiography via the sheath was performed. See above for details. The case was ended here. The sheath was left in place to be removed in recovery.  Upon completion of the case instrument and sharps counts were confirmed correct. The patient was transferred to the  PACU in good condition. I was present for all portions of the procedure.  PLAN: needs R CFA - BKPA bypass. Will check vein mapping.   Rande Brunt. Lenell Antu, MD [facility]in Specialists of [facility] Phone Number: [phone] 10/23/2022 12:58 PM

## 2022-10-23 NOTE — Progress Notes (Signed)
Right lower extremity saphenous vein mapping has been completed. Preliminary results can be found in CV Proc through chart review.   10/23/22 3:31 PM Olen Cordial RVT

## 2022-10-23 NOTE — Interval H&P Note (Signed)
History and Physical Interval Note:  10/23/2022 12:04 PM  Steven Sharp  has presented today for surgery, with the diagnosis of pad with right second toe ulcer.  The various methods of treatment have been discussed with the patient and family. After consideration of risks, benefits and other options for treatment, the patient has consented to  Procedure(s): ABDOMINAL AORTOGRAM W/LOWER EXTREMITY (N/A) as a surgical intervention.  The patient's history has been reviewed, patient examined, no change in status, stable for surgery.  I have reviewed the patient's chart and labs.  Questions were answered to the patient's satisfaction.     Leonie Douglas

## 2022-10-26 ENCOUNTER — Encounter (HOSPITAL_COMMUNITY): Payer: Self-pay | Admitting: Vascular Surgery

## 2022-10-27 MED FILL — Clopidogrel Bisulfate Tab 300 MG (Base Equiv): ORAL | Qty: 1 | Status: AC

## 2022-10-27 MED FILL — Protamine Sulfate Inj 10 MG/ML: INTRAVENOUS | Qty: 5 | Status: AC

## 2022-10-27 MED FILL — Heparin Sodium (Porcine) Inj 1000 Unit/ML: INTRAMUSCULAR | Qty: 10 | Status: AC

## 2022-10-28 ENCOUNTER — Other Ambulatory Visit: Payer: Self-pay

## 2022-10-28 DIAGNOSIS — I70235 Atherosclerosis of native arteries of right leg with ulceration of other part of foot: Secondary | ICD-10-CM

## 2022-10-28 DIAGNOSIS — I998 Other disorder of circulatory system: Secondary | ICD-10-CM

## 2022-11-02 ENCOUNTER — Other Ambulatory Visit: Payer: Self-pay

## 2022-11-02 ENCOUNTER — Encounter (HOSPITAL_COMMUNITY): Payer: Self-pay | Admitting: Vascular Surgery

## 2022-11-02 NOTE — Progress Notes (Signed)
I called Mr. Chromy cell phone, patient's daughter, Trey Sailors  answered, she said that she is his daughter and everyone has been talking to her. I explained to daughter that I need to ask Mr. Crowder for permission to speak with her and make sure we have history and give instructions, if Mr. Mcgonigle would like me to speak with her, I will call her back. I called Mr. Leedom, he said that he can speak with me. Mr. Mcwilliams denies chest pain or shortness of breath. Patient denies having any s/s of Covid in his household, also denies any known exposure to Covid. Mr.Gott denies  any s/s of upper or lower respiratory in the past 8 weeks.   Mr. PCP is with [facility] [place] [facility] [place].

## 2022-11-03 NOTE — Anesthesia Preprocedure Evaluation (Signed)
Anesthesia Evaluation  Patient identified by MRN, date of birth, ID band Patient awake    Reviewed: Allergy & Precautions, NPO status , Patient's Chart, lab work & pertinent test results  Airway Mallampati: II  TM Distance: >3 FB Neck ROM: Full    Dental  (+) Dental Advisory Given   Pulmonary Current Smoker and Patient abstained from smoking.   Pulmonary exam normal        Cardiovascular hypertension, Pt. on medications + Peripheral Vascular Disease  Normal cardiovascular exam     Neuro/Psych negative neurological ROS     GI/Hepatic negative GI ROS, Neg liver ROS,,,  Endo/Other  negative endocrine ROS    Renal/GU negative Renal ROS     Musculoskeletal negative musculoskeletal ROS (+)    Abdominal   Peds  Hematology negative hematology ROS (+)   Anesthesia Other Findings   Reproductive/Obstetrics                              Anesthesia Physical Anesthesia Plan  ASA: 3  Anesthesia Plan: General   Post-op Pain Management: Tylenol PO (pre-op)* and Toradol IV (intra-op)*   Induction: Intravenous  PONV Risk Score and Plan: 2 and Ondansetron and Dexamethasone  Airway Management Planned: Oral ETT  Additional Equipment:   Intra-op Plan:   Post-operative Plan: Extubation in OR  Informed Consent: I have reviewed the patients History and Physical, chart, labs and discussed the procedure including the risks, benefits and alternatives for the proposed anesthesia with the patient or authorized representative who has indicated his/her understanding and acceptance.     Dental advisory given  Plan Discussed with: Anesthesiologist, CRNA and Surgeon  Anesthesia Plan Comments:         Anesthesia Quick Evaluation

## 2022-11-04 ENCOUNTER — Inpatient Hospital Stay (HOSPITAL_COMMUNITY): Payer: Medicare (Managed Care) | Admitting: Certified Registered Nurse Anesthetist

## 2022-11-04 ENCOUNTER — Encounter (HOSPITAL_COMMUNITY): Payer: Self-pay | Admitting: Vascular Surgery

## 2022-11-04 ENCOUNTER — Encounter (HOSPITAL_COMMUNITY)
Admission: RE | Disposition: A | Discharge status: Skilled Nursing Facility | Payer: Self-pay | Source: Home / Self Care | Attending: Vascular Surgery

## 2022-11-04 ENCOUNTER — Inpatient Hospital Stay (HOSPITAL_COMMUNITY)
Admission: RE | Admit: 2022-11-04 | Discharge: 2022-11-12 | DRG: 253 | Disposition: A | Discharge status: Skilled Nursing Facility | Payer: Medicare (Managed Care) | Attending: Vascular Surgery | Admitting: Vascular Surgery

## 2022-11-04 ENCOUNTER — Other Ambulatory Visit: Payer: Self-pay

## 2022-11-04 DIAGNOSIS — I10 Essential (primary) hypertension: Secondary | ICD-10-CM | POA: Diagnosis present

## 2022-11-04 DIAGNOSIS — L97511 Non-pressure chronic ulcer of other part of right foot limited to breakdown of skin: Secondary | ICD-10-CM | POA: Diagnosis present

## 2022-11-04 DIAGNOSIS — Z880 Allergy status to penicillin: Secondary | ICD-10-CM | POA: Diagnosis not present

## 2022-11-04 DIAGNOSIS — L97519 Non-pressure chronic ulcer of other part of right foot with unspecified severity: Secondary | ICD-10-CM

## 2022-11-04 DIAGNOSIS — Z95828 Presence of other vascular implants and grafts: Secondary | ICD-10-CM

## 2022-11-04 DIAGNOSIS — I82511 Chronic embolism and thrombosis of right femoral vein: Secondary | ICD-10-CM | POA: Diagnosis present

## 2022-11-04 DIAGNOSIS — I998 Other disorder of circulatory system: Secondary | ICD-10-CM

## 2022-11-04 DIAGNOSIS — F17200 Nicotine dependence, unspecified, uncomplicated: Secondary | ICD-10-CM | POA: Diagnosis present

## 2022-11-04 DIAGNOSIS — I7092 Chronic total occlusion of artery of the extremities: Secondary | ICD-10-CM | POA: Diagnosis not present

## 2022-11-04 DIAGNOSIS — Z79899 Other long term (current) drug therapy: Secondary | ICD-10-CM

## 2022-11-04 DIAGNOSIS — I70235 Atherosclerosis of native arteries of right leg with ulceration of other part of foot: Principal | ICD-10-CM | POA: Diagnosis present

## 2022-11-04 DIAGNOSIS — T8131XA Disruption of external operation (surgical) wound, not elsewhere classified, initial encounter: Secondary | ICD-10-CM | POA: Diagnosis not present

## 2022-11-04 DIAGNOSIS — Z91018 Allergy to other foods: Secondary | ICD-10-CM

## 2022-11-04 DIAGNOSIS — Z7982 Long term (current) use of aspirin: Secondary | ICD-10-CM | POA: Diagnosis not present

## 2022-11-04 DIAGNOSIS — Z993 Dependence on wheelchair: Secondary | ICD-10-CM

## 2022-11-04 DIAGNOSIS — Z89612 Acquired absence of left leg above knee: Secondary | ICD-10-CM

## 2022-11-04 HISTORY — DX: Full incontinence of feces: R15.9

## 2022-11-04 HISTORY — DX: Personal history of urinary calculi: Z87.442

## 2022-11-04 HISTORY — DX: Unspecified osteoarthritis, unspecified site: M19.90

## 2022-11-04 HISTORY — DX: Malignant (primary) neoplasm, unspecified: C80.1

## 2022-11-04 HISTORY — PX: FEMORAL-POPLITEAL BYPASS GRAFT: SHX937

## 2022-11-04 LAB — CBC
HCT: 40.4 % (ref 39.0–52.0)
Hemoglobin: 13.5 g/dL (ref 13.0–17.0)
MCH: 28.7 pg (ref 26.0–34.0)
MCHC: 33.4 g/dL (ref 30.0–36.0)
MCV: 86 fL (ref 80.0–100.0)
Platelets: 199 10*3/uL (ref 150–400)
RBC: 4.7 MIL/uL (ref 4.22–5.81)
RDW: 14.1 % (ref 11.5–15.5)
WBC: 7.5 10*3/uL (ref 4.0–10.5)
nRBC: 0 % (ref 0.0–0.2)

## 2022-11-04 LAB — TYPE AND SCREEN
ABO/RH(D): A POS
Antibody Screen: NEGATIVE

## 2022-11-04 LAB — COMPREHENSIVE METABOLIC PANEL
ALT: 23 U/L (ref 0–44)
AST: 25 U/L (ref 15–41)
Albumin: 3.4 g/dL — ABNORMAL LOW (ref 3.5–5.0)
Alkaline Phosphatase: 86 U/L (ref 38–126)
Anion gap: 12 (ref 5–15)
BUN: 11 mg/dL (ref 8–23)
CO2: 23 mmol/L (ref 22–32)
Calcium: 9.1 mg/dL (ref 8.9–10.3)
Chloride: 99 mmol/L (ref 98–111)
Creatinine, Ser: 0.86 mg/dL (ref 0.61–1.24)
GFR, Estimated: >60 mL/min (ref >60)
Glucose, Bld: 102 mg/dL — ABNORMAL HIGH (ref 70–99)
Potassium: 4 mmol/L (ref 3.5–5.1)
Sodium: 134 mmol/L — ABNORMAL LOW (ref 135–145)
Total Bilirubin: 0.3 mg/dL (ref 0.3–1.2)
Total Protein: 7.5 g/dL (ref 6.5–8.1)

## 2022-11-04 LAB — ABO/RH: ABO/RH(D): A POS

## 2022-11-04 LAB — APTT: aPTT: 33 seconds (ref 24–36)

## 2022-11-04 LAB — PROTIME-INR
INR: 1 (ref 0.8–1.2)
Prothrombin Time: 13.7 seconds (ref 11.4–15.2)

## 2022-11-04 LAB — SURGICAL PCR SCREEN
MRSA, PCR: NEGATIVE
Staphylococcus aureus: POSITIVE — AB

## 2022-11-04 SURGERY — BYPASS GRAFT FEMORAL-POPLITEAL ARTERY
Anesthesia: General | Laterality: Right

## 2022-11-04 MED ORDER — PROPOFOL 10 MG/ML IV BOLUS
INTRAVENOUS | Status: AC
  Filled 2022-11-04: qty 20

## 2022-11-04 MED ORDER — ASPIRIN 81 MG PO CHEW
81.0000 mg | CHEWABLE_TABLET | Freq: Every day | ORAL | Status: DC
  Administered 2022-11-04 – 2022-11-12 (×9): 81 mg via ORAL
  Filled 2022-11-04 (×9): qty 1

## 2022-11-04 MED ORDER — DOCUSATE SODIUM 100 MG PO CAPS
100.0000 mg | ORAL_CAPSULE | Freq: Every day | ORAL | Status: DC
  Administered 2022-11-05 – 2022-11-12 (×5): 100 mg via ORAL
  Filled 2022-11-04 (×8): qty 1

## 2022-11-04 MED ORDER — SODIUM CHLORIDE 0.9 % IV SOLN: 500.0000 mL | Freq: Once | INTRAVENOUS | Status: DC | PRN

## 2022-11-04 MED ORDER — LIDOCAINE 2% (20 MG/ML) 5 ML SYRINGE
INTRAMUSCULAR | Status: AC
  Filled 2022-11-04: qty 5

## 2022-11-04 MED ORDER — LABETALOL HCL 5 MG/ML IV SOLN: 10.0000 mg | INTRAVENOUS | Status: DC | PRN

## 2022-11-04 MED ORDER — SUGAMMADEX SODIUM 200 MG/2ML IV SOLN
INTRAVENOUS | Status: DC | PRN
  Administered 2022-11-04: 200 mg via INTRAVENOUS

## 2022-11-04 MED ORDER — DEXAMETHASONE SODIUM PHOSPHATE 10 MG/ML IJ SOLN
INTRAMUSCULAR | Status: DC | PRN
  Administered 2022-11-04: 5 mg via INTRAVENOUS

## 2022-11-04 MED ORDER — CEFAZOLIN SODIUM-DEXTROSE 2-4 GM/100ML-% IV SOLN: 2.0000 g | Freq: Three times a day (TID) | INTRAVENOUS | Status: DC

## 2022-11-04 MED ORDER — PHENYLEPHRINE 80 MCG/ML (10ML) SYRINGE FOR IV PUSH (FOR BLOOD PRESSURE SUPPORT)
PREFILLED_SYRINGE | INTRAVENOUS | Status: DC | PRN
  Administered 2022-11-04: 80 ug via INTRAVENOUS

## 2022-11-04 MED ORDER — HEPARIN 6000 UNIT IRRIGATION SOLUTION
Status: AC
  Filled 2022-11-04: qty 500

## 2022-11-04 MED ORDER — SENNOSIDES-DOCUSATE SODIUM 8.6-50 MG PO TABS: 1.0000 | ORAL_TABLET | Freq: Every evening | ORAL | Status: DC | PRN

## 2022-11-04 MED ORDER — ROCURONIUM BROMIDE 10 MG/ML (PF) SYRINGE
PREFILLED_SYRINGE | INTRAVENOUS | Status: AC
  Filled 2022-11-04: qty 10

## 2022-11-04 MED ORDER — ONDANSETRON HCL 4 MG/2ML IJ SOLN: 4.0000 mg | Freq: Four times a day (QID) | INTRAMUSCULAR | Status: DC | PRN

## 2022-11-04 MED ORDER — LIDOCAINE 2% (20 MG/ML) 5 ML SYRINGE
INTRAMUSCULAR | Status: DC | PRN
  Administered 2022-11-04: 100 mg via INTRAVENOUS

## 2022-11-04 MED ORDER — AMLODIPINE BESYLATE 5 MG PO TABS
5.0000 mg | ORAL_TABLET | Freq: Every day | ORAL | Status: DC
  Administered 2022-11-04 – 2022-11-12 (×9): 5 mg via ORAL
  Filled 2022-11-04 (×9): qty 1

## 2022-11-04 MED ORDER — LACTATED RINGERS IV SOLN: INTRAVENOUS | Status: DC | PRN

## 2022-11-04 MED ORDER — PHENOL 1.4 % MT LIQD: 1.0000 | OROMUCOSAL | Status: DC | PRN

## 2022-11-04 MED ORDER — AMISULPRIDE (ANTIEMETIC) 5 MG/2ML IV SOLN: 10.0000 mg | Freq: Once | INTRAVENOUS | Status: DC | PRN

## 2022-11-04 MED ORDER — ROCURONIUM BROMIDE 10 MG/ML (PF) SYRINGE
PREFILLED_SYRINGE | INTRAVENOUS | Status: DC | PRN
  Administered 2022-11-04: 20 mg via INTRAVENOUS
  Administered 2022-11-04: 60 mg via INTRAVENOUS

## 2022-11-04 MED ORDER — VANCOMYCIN HCL IN DEXTROSE 1-5 GM/200ML-% IV SOLN
1000.0000 mg | Freq: Once | INTRAVENOUS | Status: AC
  Administered 2022-11-04: 1000 mg via INTRAVENOUS
  Filled 2022-11-04: qty 200

## 2022-11-04 MED ORDER — VANCOMYCIN HCL IN DEXTROSE 1-5 GM/200ML-% IV SOLN
1000.0000 mg | INTRAVENOUS | Status: AC
  Administered 2022-11-04: 1000 mg via INTRAVENOUS
  Filled 2022-11-04: qty 200

## 2022-11-04 MED ORDER — HEPARIN SODIUM (PORCINE) 1000 UNIT/ML IJ SOLN
INTRAMUSCULAR | Status: DC | PRN
Start: 2022-11-04 — End: 2022-11-04
  Administered 2022-11-04: 7000 [IU] via INTRAVENOUS
  Administered 2022-11-04: 2000 [IU] via INTRAVENOUS

## 2022-11-04 MED ORDER — HYDROMORPHONE HCL 1 MG/ML IJ SOLN: 0.5000 mg | INTRAMUSCULAR | Status: DC | PRN

## 2022-11-04 MED ORDER — ACETAMINOPHEN 650 MG RE SUPP: 325.0000 mg | RECTAL | Status: DC | PRN

## 2022-11-04 MED ORDER — PHENYLEPHRINE HCL-NACL 20-0.9 MG/250ML-% IV SOLN
INTRAVENOUS | Status: DC | PRN
  Administered 2022-11-04: 25 ug/min via INTRAVENOUS

## 2022-11-04 MED ORDER — PROTAMINE SULFATE 10 MG/ML IV SOLN
INTRAVENOUS | Status: AC
  Filled 2022-11-04: qty 5

## 2022-11-04 MED ORDER — ACETAMINOPHEN 500 MG PO TABS
1000.0000 mg | ORAL_TABLET | Freq: Once | ORAL | Status: AC
  Administered 2022-11-04: 1000 mg via ORAL
  Filled 2022-11-04: qty 2

## 2022-11-04 MED ORDER — CHLORHEXIDINE GLUCONATE CLOTH 2 % EX PADS: 6.0000 | MEDICATED_PAD | Freq: Once | CUTANEOUS | Status: DC

## 2022-11-04 MED ORDER — DEXAMETHASONE SODIUM PHOSPHATE 10 MG/ML IJ SOLN
INTRAMUSCULAR | Status: AC
  Filled 2022-11-04: qty 1

## 2022-11-04 MED ORDER — LISINOPRIL 5 MG PO TABS
5.0000 mg | ORAL_TABLET | Freq: Every day | ORAL | Status: DC
  Administered 2022-11-04 – 2022-11-12 (×9): 5 mg via ORAL
  Filled 2022-11-04 (×9): qty 1

## 2022-11-04 MED ORDER — PHENYLEPHRINE 80 MCG/ML (10ML) SYRINGE FOR IV PUSH (FOR BLOOD PRESSURE SUPPORT)
PREFILLED_SYRINGE | INTRAVENOUS | Status: AC
  Filled 2022-11-04: qty 10

## 2022-11-04 MED ORDER — METOPROLOL TARTRATE 5 MG/5ML IV SOLN: 2.0000 mg | INTRAVENOUS | Status: DC | PRN

## 2022-11-04 MED ORDER — ACETAMINOPHEN 10 MG/ML IV SOLN: 1000.0000 mg | Freq: Once | INTRAVENOUS | Status: DC | PRN

## 2022-11-04 MED ORDER — HEPARIN SODIUM (PORCINE) 5000 UNIT/ML IJ SOLN
5000.0000 [IU] | Freq: Three times a day (TID) | INTRAMUSCULAR | Status: DC
  Administered 2022-11-05 – 2022-11-12 (×21): 5000 [IU] via SUBCUTANEOUS
  Filled 2022-11-04 (×22): qty 1

## 2022-11-04 MED ORDER — GUAIFENESIN-DM 100-10 MG/5ML PO SYRP: 15.0000 mL | ORAL_SOLUTION | ORAL | Status: DC | PRN

## 2022-11-04 MED ORDER — PANTOPRAZOLE SODIUM 40 MG PO TBEC
40.0000 mg | DELAYED_RELEASE_TABLET | Freq: Every day | ORAL | Status: DC
  Administered 2022-11-04 – 2022-11-12 (×9): 40 mg via ORAL
  Filled 2022-11-04 (×9): qty 1

## 2022-11-04 MED ORDER — HEPARIN 6000 UNIT IRRIGATION SOLUTION
Status: DC | PRN
  Administered 2022-11-04: 1

## 2022-11-04 MED ORDER — ATORVASTATIN CALCIUM 40 MG PO TABS
40.0000 mg | ORAL_TABLET | Freq: Every day | ORAL | Status: DC
  Administered 2022-11-04 – 2022-11-05 (×2): 40 mg via ORAL
  Filled 2022-11-04 (×2): qty 1

## 2022-11-04 MED ORDER — OXYCODONE-ACETAMINOPHEN 5-325 MG PO TABS: 1.0000 | ORAL_TABLET | ORAL | Status: DC | PRN

## 2022-11-04 MED ORDER — ACETAMINOPHEN 325 MG PO TABS: 325.0000 mg | ORAL_TABLET | ORAL | Status: DC | PRN

## 2022-11-04 MED ORDER — PROPOFOL 10 MG/ML IV BOLUS
INTRAVENOUS | Status: DC | PRN
Start: 2022-11-04 — End: 2022-11-04
  Administered 2022-11-04: 80 mg via INTRAVENOUS
  Administered 2022-11-04: 30 mg via INTRAVENOUS

## 2022-11-04 MED ORDER — HEMOSTATIC AGENTS (NO CHARGE) OPTIME
TOPICAL | Status: DC | PRN
  Administered 2022-11-04: 1 via TOPICAL

## 2022-11-04 MED ORDER — 0.9 % SODIUM CHLORIDE (POUR BTL) OPTIME
TOPICAL | Status: DC | PRN
  Administered 2022-11-04: 2000 mL

## 2022-11-04 MED ORDER — FENTANYL CITRATE (PF) 250 MCG/5ML IJ SOLN
INTRAMUSCULAR | Status: AC
  Filled 2022-11-04: qty 5

## 2022-11-04 MED ORDER — PROTAMINE SULFATE 10 MG/ML IV SOLN
INTRAVENOUS | Status: DC | PRN
  Administered 2022-11-04: 50 mg via INTRAVENOUS

## 2022-11-04 MED ORDER — HEPARIN SODIUM (PORCINE) 1000 UNIT/ML IJ SOLN
INTRAMUSCULAR | Status: AC
  Filled 2022-11-04: qty 10

## 2022-11-04 MED ORDER — FENTANYL CITRATE (PF) 250 MCG/5ML IJ SOLN
INTRAMUSCULAR | Status: DC | PRN
  Administered 2022-11-04 (×3): 50 ug via INTRAVENOUS

## 2022-11-04 MED ORDER — POTASSIUM CHLORIDE CRYS ER 20 MEQ PO TBCR: 20.0000 meq | EXTENDED_RELEASE_TABLET | Freq: Every day | ORAL | Status: DC | PRN

## 2022-11-04 MED ORDER — MAGNESIUM SULFATE 2 GM/50ML IV SOLN: 2.0000 g | Freq: Every day | INTRAVENOUS | Status: DC | PRN

## 2022-11-04 MED ORDER — CHLORHEXIDINE GLUCONATE 0.12 % MT SOLN
OROMUCOSAL | Status: AC
  Administered 2022-11-04: 15 mL
  Filled 2022-11-04: qty 15

## 2022-11-04 MED ORDER — ONDANSETRON HCL 4 MG/2ML IJ SOLN
INTRAMUSCULAR | Status: DC | PRN
  Administered 2022-11-04: 4 mg via INTRAVENOUS

## 2022-11-04 MED ORDER — SODIUM CHLORIDE 0.9 % IV SOLN: INTRAVENOUS | Status: DC

## 2022-11-04 MED ORDER — BISACODYL 5 MG PO TBEC: 5.0000 mg | DELAYED_RELEASE_TABLET | Freq: Every day | ORAL | Status: DC | PRN

## 2022-11-04 MED ORDER — OXYCODONE HCL 5 MG/5ML PO SOLN: 5.0000 mg | Freq: Once | ORAL | Status: DC | PRN

## 2022-11-04 MED ORDER — OXYCODONE HCL 5 MG PO TABS: 5.0000 mg | ORAL_TABLET | Freq: Once | ORAL | Status: DC | PRN

## 2022-11-04 MED ORDER — ONDANSETRON HCL 4 MG/2ML IJ SOLN
INTRAMUSCULAR | Status: AC
  Filled 2022-11-04: qty 2

## 2022-11-04 MED ORDER — FENTANYL CITRATE (PF) 100 MCG/2ML IJ SOLN: 25.0000 ug | INTRAMUSCULAR | Status: DC | PRN

## 2022-11-04 MED ORDER — ALUM & MAG HYDROXIDE-SIMETH 200-200-20 MG/5ML PO SUSP: 15.0000 mL | ORAL | Status: DC | PRN

## 2022-11-04 MED ORDER — HYDRALAZINE HCL 20 MG/ML IJ SOLN: 5.0000 mg | INTRAMUSCULAR | Status: DC | PRN

## 2022-11-04 SURGICAL SUPPLY — 65 items
APL PRP STRL LF DISP 70% ISPRP (MISCELLANEOUS) ×2
APL SKNCLS STERI-STRIP NONHPOA (GAUZE/BANDAGES/DRESSINGS) ×2
BAG COUNTER SPONGE SURGICOUNT (BAG) ×1 IMPLANT
BAG SPNG CNTER NS LX DISP (BAG) ×1
BANDAGE ESMARK 6X9 LF (GAUZE/BANDAGES/DRESSINGS) IMPLANT
BENZOIN TINCTURE PRP APPL 2/3 (GAUZE/BANDAGES/DRESSINGS) IMPLANT
BNDG CMPR 9X6 STRL LF SNTH (GAUZE/BANDAGES/DRESSINGS)
BNDG ESMARK 6X9 LF (GAUZE/BANDAGES/DRESSINGS)
CANISTER SUCT 3000ML PPV (MISCELLANEOUS) ×1 IMPLANT
CANNULA VESSEL 3MM 2 BLNT TIP (CANNULA) ×2 IMPLANT
CATH COUDE FOLEY 2W 5CC 18FR (CATHETERS) IMPLANT
CATH FOLEY 3WAY 5CC 16FR (CATHETERS) IMPLANT
CATH ROBINSON RED A/P 10FR (CATHETERS) IMPLANT
CHLORAPREP W/TINT 26 (MISCELLANEOUS) ×2 IMPLANT
CUFF TOURN SGL QUICK 24 (TOURNIQUET CUFF)
CUFF TOURN SGL QUICK 34 (TOURNIQUET CUFF) ×1
CUFF TOURN SGL QUICK 42 (TOURNIQUET CUFF) IMPLANT
CUFF TRNQT CYL 24X4X16.5-23 (TOURNIQUET CUFF) IMPLANT
CUFF TRNQT CYL 34X4.125X (TOURNIQUET CUFF) IMPLANT
CUFF TRNQT CYL 34X4X40X1 (TOURNIQUET CUFF) IMPLANT
DRAIN CHANNEL 15F RND FF W/TCR (WOUND CARE) IMPLANT
DRAIN PENROSE 12X.25 LTX STRL (MISCELLANEOUS) IMPLANT
DRAIN RELI 100 BL SUC LF ST (DRAIN)
DRAPE TOWEL STERILE LF 18X24 (DRAPES) IMPLANT
DRESSING MEPILEX FLEX 4X4 (GAUZE/BANDAGES/DRESSINGS) IMPLANT
DRSG MEPILEX FLEX 4X4 (GAUZE/BANDAGES/DRESSINGS) ×2
DRSG MEPILEX POST OP 4X12 (GAUZE/BANDAGES/DRESSINGS) IMPLANT
DRSG TEGADERM 4X4.75 (GAUZE/BANDAGES/DRESSINGS) IMPLANT
ELECT REM PT RETURN 9FT ADLT (ELECTROSURGICAL) ×1
ELECTRODE REM PT RTRN 9FT ADLT (ELECTROSURGICAL) ×1 IMPLANT
EVACUATOR SILICONE 100CC (DRAIN) IMPLANT
GAUZE SPONGE 4X4 12PLY STRL (GAUZE/BANDAGES/DRESSINGS) ×1 IMPLANT
GLOVE BIO SURGEON STRL SZ8 (GLOVE) ×1 IMPLANT
GOWN STRL REUS W/ TWL LRG LVL3 (GOWN DISPOSABLE) ×2 IMPLANT
GOWN STRL REUS W/ TWL XL LVL3 (GOWN DISPOSABLE) ×1 IMPLANT
GOWN STRL REUS W/TWL LRG LVL3 (GOWN DISPOSABLE) ×1
GOWN STRL REUS W/TWL XL LVL3 (GOWN DISPOSABLE) ×1
GRAFT PROPATEN W/RING 6X80X60 (Vascular Products) IMPLANT
HEMOSTAT SNOW SURGICEL 2X4 (HEMOSTASIS) IMPLANT
INSERT FOGARTY SM (MISCELLANEOUS) IMPLANT
KIT BASIN OR (CUSTOM PROCEDURE TRAY) ×1 IMPLANT
KIT TURNOVER KIT B (KITS) ×1 IMPLANT
MARKER GRAFT CORONARY BYPASS (MISCELLANEOUS) IMPLANT
NS IRRIG 1000ML POUR BTL (IV SOLUTION) ×2 IMPLANT
PACK PERIPHERAL VASCULAR (CUSTOM PROCEDURE TRAY) ×1 IMPLANT
PAD ARMBOARD 7.5X6 YLW CONV (MISCELLANEOUS) ×2 IMPLANT
SET COLLECT BLD 21X3/4 12 (NEEDLE) IMPLANT
STOPCOCK 4 WAY LG BORE MALE ST (IV SETS) IMPLANT
STRIP CLOSURE SKIN 1/2X4 (GAUZE/BANDAGES/DRESSINGS) IMPLANT
SUT ETHILON 3 0 PS 1 (SUTURE) IMPLANT
SUT MNCRL AB 4-0 PS2 18 (SUTURE) ×2 IMPLANT
SUT PROLENE 5 0 C 1 24 (SUTURE) ×1 IMPLANT
SUT PROLENE 6 0 BV (SUTURE) ×1 IMPLANT
SUT SILK 2 0 SH (SUTURE) ×1 IMPLANT
SUT SILK 3 0 (SUTURE)
SUT SILK 3-0 18XBRD TIE 12 (SUTURE) IMPLANT
SUT VIC AB 2-0 CT1 27 (SUTURE) ×2
SUT VIC AB 2-0 CT1 TAPERPNT 27 (SUTURE) ×2 IMPLANT
SUT VIC AB 3-0 SH 27 (SUTURE) ×2
SUT VIC AB 3-0 SH 27X BRD (SUTURE) ×2 IMPLANT
TAPE UMBILICAL 1/8X30 (MISCELLANEOUS) IMPLANT
TOWEL GREEN STERILE (TOWEL DISPOSABLE) ×1 IMPLANT
TRAY FOLEY MTR SLVR 16FR STAT (SET/KITS/TRAYS/PACK) ×1 IMPLANT
UNDERPAD 30X36 HEAVY ABSORB (UNDERPADS AND DIAPERS) ×1 IMPLANT
WATER STERILE IRR 1000ML POUR (IV SOLUTION) ×1 IMPLANT

## 2022-11-04 NOTE — Op Note (Signed)
DATE OF SERVICE: 11/04/2022  PATIENT:  Steven Sharp  77 y.o. male  PRE-OPERATIVE DIAGNOSIS:  atherosclerosis of native arteries of right lower extremity causing ulceration  POST-OPERATIVE DIAGNOSIS:  Same  PROCEDURE:   Right common femoral to tibioperoneal trunk bypass with 6mm ePTFE in subfascial tunnel  SURGEON:  Surgeons and Role:    * Leonie Douglas, MD - Primary  ASSISTANT: Nathanial Rancher, PA-C  An experienced assistant was required given the complexity of this procedure and the standard of surgical care. My assistant helped with exposure through counter tension, suctioning, ligation and retraction to better visualize the surgical field.  My assistant expedited sewing during the case by following my sutures. Wherever I use the term "we" in the report, my assistant actively helped me with that portion of the procedure.  ANESTHESIA:   general  EBL:  BLOOD ADMINISTERED:none  DRAINS: none   LOCAL MEDICATIONS USED:  NONE  SPECIMEN:  none  COUNTS: confirmed correct .  TOURNIQUET:    Total Tourniquet Time Documented: Thigh (Right) - 23 minutes Total: Thigh (Right) - 23 minutes   PATIENT DISPOSITION:  PACU - hemodynamically stable.   Delay start of Pharmacological VTE agent (>24hrs) due to surgical blood loss or risk of bleeding: no  INDICATION FOR PROCEDURE: Steven Sharp is a 77 y.o. 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  Angiogram was performed which showed flush occlusion of the superficial femoral artery and reconstitution of the popliteal artery.  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 After careful discussion of risks, benefits, and alternatives the patient was offered left femoral to popliteal artery bypass. The patient understood and wished to proceed.  OPERATIVE FINDINGS: The hood of the prior femoral-femoral bypass was identified and used as inflow for our bypass.  There is  known focal stenosis at the tibioperoneal trunk and below-knee popliteal artery about the origin of the anterior tibial artery.  The anterior tibial artery was the dominant runoff to the foot.  I performed a did endarterectomy with good result.  Good technical result achieved from bypass.  Good flow in the anterior tibial artery immediately distal to the bypass.  Biphasic flow in the anterior tibial artery at the ankle.  This was graft dependent.  DESCRIPTION OF PROCEDURE: After identification of the patient in the pre-operative holding area, the patient was transferred to the operating room. The patient was positioned supine on the operating room table. Anesthesia was induced. The right leg was prepped and draped in standard fashion. A surgical pause was performed confirming correct patient, procedure, and operative location.  An oblique incision was made in the right groin over the common femoral artery.  The incision was carried down through subcutaneous tissue using Bovie electrocautery the hood of the prior femoral-femoral bypass was identified and exposed down onto its anastomosis.  This is found to be patent.  This was isolated proximally and distally.  I elected to use this as the inflow of the femoral-femoral bypass to avoid further exposure through scar.  Longitudinal incision was made in the right proximal calf to expose the below-knee popliteal artery.  The incision was carried down through subcutaneous tissue until the superficial posterior fascia was encountered.  This was divided with Bovie electrocautery.  The semimembranosus and semitendinosus tendons were divided.  The gastrocnemius was swept posteriorly.  The popliteal vascular bundle was identified and skeletonized.  The anterior tibial vein was divided between silk suture.  This  exposed the tibioperoneal trunk and the takeoff of the anterior tibial artery.  We identified focal plaque about this area.  Elected to make a longitudinal  incision across this and performed a limited endarterectomy to remove the critical stenosis at the origin of the anterior tibial artery.  A subfascial tunnel was made connecting the 2 exposures with a Kelly-Wick tunneler.  A 6 mm externally supported PTFE vascular graft was delivered through the sheath of the tunneler.  The sheath was removed.  The patient was systemically heparinized.  Patient was empirically redosed with heparin throughout the case to maintain adequate anticoagulation.  Proximal anastomosis was performed first.  Clamp was applied to the previous femoral-femoral bypass proximally and distally.  The graft was transected.  Chronic thrombus was encountered and removed.  A good lumen was then identified.  Inflow was tested and found to be brisk.  The proximal end of the vascular graft was spatulated and sewn end-to-end to the transected femoral-femoral bypass origin.  This was performed with continuous running suture of 5-0 Prolene.  The anastomosis was completed and flushed on the open end of the vascular graft.  Good flow was noted.  A pneumatic tourniquet was applied to the right thigh.  The right leg was exsanguinated with an Esmarch tourniquet.  The pneumatic tourniquet was inflated.  A time was noted.  A anterior and medial arteriotomy was made with an 11 blade and extended with Potts scissors across the focal TP trunk stenosis.  The incision extended from the tibioperoneal trunk to the below-knee popliteal artery from healthy artery to healthy artery.  A limited popliteal and tibioperoneal trunk endarterectomy was performed.  We ensured the origin of the anterior tibial artery was patent with a fine Crile clamp.  The distal graft was spatulated and sewn end-to-side to the arteriotomy using continuous running suture of 6-0 Prolene.  Prior to completion the pneumatic tourniquet was released.  Good backbleeding was achieved.  The repair was completed.  Clamp was released on the bypass  graft.  Pulsatile flow was noted into the native artery.  The repair was evaluated with Doppler machine.  Brisk Doppler flow was noted in the anterior tibial artery was which was graft dependent just distal to the anastomosis.  We also identified anterior tibial artery at the ankle with good Doppler flow.  This was also graft offended.  Satisfied we ended the case here.    Heparin was reversed with protamine.  Hemostasis was achieved in the anastomoses in the surgical beds.  The wounds were closed in layers using 2-0 Vicryl, 3-0 Vicryl, 4-0 Monocryl.  Benzoin, Steri-Strips, gauze and Tegaderm dressings were applied.  Upon completion of the case instrument and sharps counts were confirmed correct. The patient was transferred to the  PACU in good condition. I was present for all portions of the procedure.  FOLLOW UP PLAN: Assuming a normal postoperative course, I will see the patient in 4 weeks with ABI.   Rande Brunt. Lenell Antu, MD [facility] Special[facility] of [facility] Phone Number: [phone] 11/04/2022 10:14 AM

## 2022-11-04 NOTE — Anesthesia Postprocedure Evaluation (Signed)
Anesthesia Post Note  Patient: Steven Sharp  Procedure(s) Performed: RIGHT COMMON FEMORAL-TIBIAL PERITONEAL TRUNK ARTERY BYPASS USING 6mm PROPATEN REMOVABLE RING GORETEX GRAFT (Right)     Patient location during evaluation: PACU Anesthesia Type: General Level of consciousness: sedated Pain management: pain level controlled Vital Signs Assessment: post-procedure vital signs reviewed and stable Respiratory status: spontaneous breathing and respiratory function stable Cardiovascular status: stable Postop Assessment: no apparent nausea or vomiting Anesthetic complications: no   No notable events documented.  Last Vitals:  Vitals:   11/04/22 1045 11/04/22 1102  BP: 109/61 117/67  Pulse: 84 81  Resp: 15 18  Temp: 36.6 C (!) 36.4 C  SpO2: 95% 95%    Last Pain:  Vitals:   11/04/22 1122  TempSrc:   PainSc: 0-No pain                 Annamary Buschman DANIEL

## 2022-11-04 NOTE — OR Nursing (Signed)
Foley attempted three times by M. Emmanuel Ercole using a 16Fr, 14Fr, and a 10Fr. Dr. Lenell Antu tried to then attempt a foley using coude 16Fr and 18Fr. Dr. Lenell Antu decided that we would proceed without a foley and he would contact Urology if the patient needed at a later time.

## 2022-11-04 NOTE — Interval H&P Note (Signed)
History and Physical Interval Note:  11/04/2022 7:22 AM  Steven Sharp  has presented today for surgery, with the diagnosis of Atherosclerosis of native arteries of right leg with ulceration of other part of foot.  The various methods of treatment have been discussed with the patient and family. After consideration of risks, benefits and other options for treatment, the patient has consented to  Procedure(s): RIGHT FEMORAL-BELOW KNEE POPLITEAL ARTERY BYPASS (Right) as a surgical intervention.  The patient's history has been reviewed, patient examined, no change in status, stable for surgery.  I have reviewed the patient's chart and labs.  Questions were answered to the patient's satisfaction.     Leonie Douglas

## 2022-11-04 NOTE — Anesthesia Procedure Notes (Signed)
Procedure Name: Intubation Date/Time: 11/04/2022 7:39 AM  Performed by: Waynard Edwards, CRNAPre-anesthesia Checklist: Patient identified, Emergency Drugs available, Suction available and Patient being monitored Patient Re-evaluated:Patient Re-evaluated prior to induction Oxygen Delivery Method: Circle system utilized Preoxygenation: Pre-oxygenation with 100% oxygen Induction Type: IV induction Ventilation: Mask ventilation without difficulty and Oral airway inserted - appropriate to patient size Laryngoscope Size: Mac and 3 Grade View: Grade I Tube type: Oral Tube size: 7.5 mm Number of attempts: 1 Airway Equipment and Method: Stylet and Oral airway Placement Confirmation: ETT inserted through vocal cords under direct vision, positive ETCO2 and breath sounds checked- equal and bilateral Secured at: 21 cm Tube secured with: Tape Dental Injury: Teeth and Oropharynx as per pre-operative assessment

## 2022-11-04 NOTE — Progress Notes (Signed)
Pt arrived from ..pacu..., A/ox .4..pt denies any pain, MD aware,CCMD called. CHG bath given,no further needs at this time   

## 2022-11-04 NOTE — Plan of Care (Signed)
  Problem: Cardiovascular: Goal: Vascular access site(s) Level 0-1 will be maintained Outcome: Progressing   Problem: Health Behavior/Discharge Planning: Goal: Ability to safely manage health-related needs after discharge will improve Outcome: Progressing   Problem: Education: Goal: Knowledge of prescribed regimen will improve Outcome: Progressing

## 2022-11-04 NOTE — Progress Notes (Signed)
Dr. Lenell Antu made aware that patient was unable to give a urine specimen for a urinalysis. Ok to proceed without sample per Dr. Lenell Antu.

## 2022-11-04 NOTE — Transfer of Care (Signed)
Immediate Anesthesia Transfer of Care Note  Patient: Steven Sharp  Procedure(s) Performed: RIGHT COMMON FEMORAL-TIBIAL PERITONEAL TRUNK ARTERY BYPASS USING 6mm PROPATEN REMOVABLE RING GORETEX GRAFT (Right)  Patient Location: PACU  Anesthesia Type:General  Level of Consciousness: drowsy and patient cooperative  Airway & Oxygen Therapy: Patient Spontanous Breathing and Patient connected to face mask oxygen  Post-op Assessment: Report given to RN and Post -op Vital signs reviewed and stable  Post vital signs: Reviewed and stable, see post op PACU VS flowsheets  Last Vitals:  Vitals Value Taken Time  BP    Temp    Pulse    Resp    SpO2      Last Pain:  Vitals:   11/04/22 0646  TempSrc:   PainSc: 0-No pain      Patients Stated Pain Goal: 0 (11/04/22 0646)  Complications: No notable events documented.

## 2022-11-04 NOTE — Discharge Instructions (Addendum)
   [facility]  Discharge instructions  Lower Extremity Bypass Surgery  Please refer to the following instruction for your post-procedure care. Your surgeon or physician assistant will discuss any changes with you.  Activity  You are encouraged to walk as much as you can. You can slowly return to normal activities during the month after your surgery. Avoid strenuous activity and heavy lifting until your doctor tells you it's OK. Avoid activities such as vacuuming or swinging a golf club. Do not drive until your doctor give the OK and you are no longer taking prescription pain medications. It is also normal to have difficulty with sleep habits, eating and bowel movement after surgery. These will go away with time.  Incision Care  Clean your groin incision with mild soap and water. Pat the area dry with a clean towel. Do not apply any ointments or creams to your incision. Your steri strips will come off in about 2 weeks. The prevena vac will stay on for 1 week and we will remove this in office on 11/19/2022.  Diet  Resume your normal diet. There are no special food restrictions following this procedure. A low fat/ low cholesterol diet is recommended for all patients with vascular disease. In order to heal from your surgery, it is CRITICAL to get adequate nutrition. Your body requires vitamins, minerals, and protein. Vegetables are the best source of vitamins and minerals. Vegetables also provide the perfect balance of protein. Processed food has little nutritional value, so try to avoid this.  Medications  Resume taking all your medications unless your doctor or physician assistant tells you not to. If your incision is causing pain, you may take over-the-counter pain relievers such as acetaminophen (Tylenol). If you were prescribed a stronger pain medication, please aware these medication can cause nausea and constipation. Prevent nausea by taking the medication  with a snack or meal. Avoid constipation by drinking plenty of fluids and eating foods with high amount of fiber, such as fruits, vegetables, and grains. Take Colace 100 mg (an over-the-counter stool softener) twice a day as needed for constipation.  Do not take Tylenol if you are taking prescription pain medications.  Follow Up  You will follow up with our office on 11/19/2022 for vac removal and incision check  Please call us immediately for any of the following conditions  -Severe or worsening pain in your legs or feet while at rest or while walking  - Increased pain, redness, warmth, or drainage (pus) from your incision site(s) - Fever of 101 degree or higher  Leg swelling is common after leg bypass surgery.  The swelling should improve over a few months following surgery. To improve the swelling, you may elevate your legs above the level of your heart while you are sitting or resting. Your surgeon or physician assistant may ask you to apply an ACE wrap or wear compression (TED) stockings to help to reduce swelling.  Reduce your risk of vascular disease  Stop smoking. If you would like help call [facility] at 1-800-QUIT-NOW (564-875-3340) or [place] at 7402147533.  Manage your cholesterol Maintain a desired weight Control your diabetes weight Control your diabetes Keep your blood pressure down  If you have any questions, please call the office at [phone]

## 2022-11-05 ENCOUNTER — Encounter (HOSPITAL_COMMUNITY): Payer: Self-pay | Admitting: Vascular Surgery

## 2022-11-05 ENCOUNTER — Telehealth: Payer: Self-pay | Admitting: Vascular Surgery

## 2022-11-05 LAB — LIPID PANEL
Cholesterol: 147 mg/dL (ref 0–200)
HDL: 45 mg/dL (ref >40)
LDL Cholesterol: 91 mg/dL (ref 0–99)
Total CHOL/HDL Ratio: 3.3 RATIO
Triglycerides: 56 mg/dL (ref <150)
VLDL: 11 mg/dL (ref 0–40)

## 2022-11-05 MED ORDER — ATORVASTATIN CALCIUM 80 MG PO TABS
80.0000 mg | ORAL_TABLET | Freq: Every day | ORAL | Status: DC
  Administered 2022-11-06 – 2022-11-12 (×7): 80 mg via ORAL
  Filled 2022-11-05 (×7): qty 1

## 2022-11-05 MED ORDER — CHLORHEXIDINE GLUCONATE CLOTH 2 % EX PADS
6.0000 | MEDICATED_PAD | Freq: Every day | CUTANEOUS | Status: AC
  Administered 2022-11-05 – 2022-11-09 (×5): 6 via TOPICAL

## 2022-11-05 MED ORDER — MUPIROCIN 2 % EX OINT
1.0000 | TOPICAL_OINTMENT | Freq: Two times a day (BID) | CUTANEOUS | Status: AC
  Administered 2022-11-05 – 2022-11-09 (×7): 1 via NASAL
  Filled 2022-11-05 (×2): qty 22

## 2022-11-05 NOTE — Plan of Care (Signed)
  Problem: Education: Goal: Understanding of CV disease, CV risk reduction, and recovery process will improve Outcome: Progressing Goal: Individualized Educational Video(s) Outcome: Progressing   Problem: Activity: Goal: Ability to return to baseline activity level will improve Outcome: Progressing   Problem: Cardiovascular: Goal: Ability to achieve and maintain adequate cardiovascular perfusion will improve Outcome: Progressing Goal: Vascular access site(s) Level 0-1 will be maintained Outcome: Progressing   Problem: Health Behavior/Discharge Planning: Goal: Ability to safely manage health-related needs after discharge will improve Outcome: Progressing   Problem: Education: Goal: Knowledge of prescribed regimen will improve Outcome: Progressing   Problem: Activity: Goal: Ability to tolerate increased activity will improve Outcome: Progressing   Problem: Bowel/Gastric: Goal: Gastrointestinal status for postoperative course will improve Outcome: Progressing   Problem: Clinical Measurements: Goal: Postoperative complications will be avoided or minimized Outcome: Progressing Goal: Signs and symptoms of graft occlusion will improve Outcome: Progressing   Problem: Skin Integrity: Goal: Demonstration of wound healing without infection will improve Outcome: Progressing   

## 2022-11-05 NOTE — Telephone Encounter (Signed)
Pt is currently still in hospital

## 2022-11-05 NOTE — TOC Progression Note (Signed)
Transition of Care [facility]) - Progression Note    Patient Details  Name: Steven Sharp MRN: [id number] Date of Birth: 1946-02-18  Transition of Care [facility]) CM/SW Contact  Eduard Roux, [place] Phone Number: 11/05/2022, 2:05 PM  Clinical Narrative:     Called patient's daughter- left voice message to return call.   Spoke with [facility] SW French Ana - informed of recommendations of short term rehab at [facility]. She states she will update the team - she states patient's daughter works and may not be able to return call until after hours.   TOC will continue to follow and assist with discharge planning.   Antony Blackbird, MSW, LCSW Clinical Social Worker         Expected Discharge Plan and Services                                               Social Determinants of Health (SDOH) Interventions SDOH Screenings   Tobacco Use: High Risk (11/04/2022)    Readmission Risk Interventions     No data to display

## 2022-11-05 NOTE — Progress Notes (Signed)
Pt had foam dressing to Right shin area that had old drainage. Dressing was almost saturated in serosanguineous drainage, and pt started bleeding around the dressing. Old dressing removed, site cleaned with normal saline. Site had some steri strip covered with 4x4 , abd pad and kurlex. Leg elevated in a pillow. Plan of care continues.

## 2022-11-05 NOTE — Telephone Encounter (Signed)
-----   Message from Leonie Douglas sent at 11/04/2022 10:29 AM EDT ----- Steven Sharp 11/04/2022 Procedure: Right common femoral to tibioperoneal trunk bypass with 6mm ePTFE in subfascial tunnel Assistant: Baglia Follow up: 4 weeks with me Studies for follow up: ABI  Thank you! Elijah Birk

## 2022-11-05 NOTE — Progress Notes (Signed)
PHARMACIST LIPID MONITORING   Steven Sharp is a 77 y.o. male admitted on 11/04/2022 with PVD.  Pharmacy has been consulted to optimize lipid-lowering therapy with the indication of secondary prevention for clinical ASCVD.  Recent Labs:  Lipid Panel (last 6 months):   Lab Results  Component Value Date   CHOL 147 11/05/2022   TRIG 56 11/05/2022   HDL 45 11/05/2022   CHOLHDL 3.3 11/05/2022   VLDL 11 11/05/2022   LDLCALC 91 11/05/2022    Hepatic function panel (last 6 months):   Lab Results  Component Value Date   AST 25 11/04/2022   ALT 23 11/04/2022   ALKPHOS 86 11/04/2022   BILITOT 0.3 11/04/2022    SCr (since admission):   Serum creatinine: 1.05 mg/dL 73/22/02 5427 Estimated creatinine clearance: 52.3 mL/min  Current therapy and lipid therapy tolerance Current lipid-lowering therapy: atorvastatin 20mg /day Previous lipid-lowering therapies (if applicable): none Documented or reported allergies or intolerances to lipid-lowering therapies (if applicable): none   Plan:    1.Statin intensity (high intensity recommended for all patients regardless of the LDL):  Add or increase statin to high intensity.  2.Add ezetimibe (if any one of the following):   Not indicated at this time.  3.Refer to lipid clinic:   No  4.Follow-up with:  Primary care provider - [facility] [address]  5.Follow-up labs after discharge:  Changes in lipid therapy were made. Check a lipid panel in 8-12 weeks then annually.     Harland German, PharmD Clinical Pharmacist **Pharmacist phone directory can now be found on [email or web address] ([email or web address]).  Listed under [facility].

## 2022-11-05 NOTE — Evaluation (Signed)
Occupational Therapy Evaluation Patient Details Name: Steven Sharp MRN: [id number] DOB: May 24, 1945 Today's Date: 11/05/2022   History of Present Illness Pt is a 77 y/o male s/p right common femoral to TPT bypass with PTFE. PMH including but not limited to HTN, PAD, L AKA (1995), ?incomplete SCI (1995).   Clinical Impression   Pt lives alone with frequent visits by his daughter. [facility]. Pt is dependent at bed level, bathing and dressing. He uses a brief for toileting that his daughter assists him in changing. Pt is assisted to transfer by his daughter and propels his motorized w/c independently. Pt can self feed and groom with set up. 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 Pt presents with generalized weakness and requires +2 total assist for transfers bed<>w/c. May want to consider lift equipment if pt returns home. No further OT needs.     Recommendations for follow up therapy are one component of a multi-disciplinary discharge planning process, led by the attending physician.  Recommendations may be updated based on patient status, additional functional criteria and insurance authorization.   Assistance Recommended at Discharge Intermittent Supervision/Assistance  Patient can return home with the following Two people to help with walking and/or transfers;A lot of help with bathing/dressing/bathroom;Assistance with cooking/housework;Direct supervision/assist for medications management;Direct supervision/assist for financial management;Help with stairs or ramp for entrance;Assist for transportation    Functional Status Assessment  Patient has had a recent decline in their functional status and demonstrates the ability to make significant improvements in function in a reasonable and predictable amount of time.  Equipment Recommendations  None recommended by OT    Recommendations for Other Services       Precautions / Restrictions  Precautions Precautions: Fall Precaution Comments: hx of L AKA Restrictions Weight Bearing Restrictions: No      Mobility Bed Mobility Overal bed mobility: Needs Assistance Bed Mobility: Supine to Sit, Sit to Supine     Supine to sit: Total assist, +2 for physical assistance, HOB elevated Sit to supine: Total assist, +2 for physical assistance   General bed mobility comments: assist for all aspects    Transfers Overall transfer level: Needs assistance Equipment used: None Transfers: Bed to chair/wheelchair/BSC     Squat pivot transfers: Total assist, +2 physical assistance       General transfer comment: pt completing bed to power w/c transfer and back to bed with total A x2. Pt very particular about the technique and set-up (not allowing therapists to use bed pads) as pt reporting that his daughter assists him with transfers at home. Use of gait belt and moment to complete with pt insisting that his R foot be positioned on the footplate of his w/c      Balance Overall balance assessment: Needs assistance Sitting-balance support: Bilateral upper extremity supported, Single extremity supported Sitting balance-Leahy Scale: Poor     Standing balance support: During functional activity Standing balance-Leahy Scale: Zero                             ADL either performed or assessed with clinical judgement   ADL Overall ADL's : At baseline                                       General ADL Comments: pt can self feed and  groom with set up at bed level, he is dependent in bathing, dressing and toileting at baseline     Vision Baseline Vision/History: 0 No visual deficits       Perception     Praxis      Pertinent Vitals/Pain Pain Assessment Pain Assessment: Faces Faces Pain Scale: Hurts a little bit Pain Location: buttocks Pain Descriptors / Indicators: Sore Pain Intervention(s): Repositioned     Hand Dominance Right    Extremity/Trunk Assessment Upper Extremity Assessment Upper Extremity Assessment: RUE deficits/detail RUE Deficits / Details: arthritic changes throughout with limited reach and decreased fine motor RUE Coordination: decreased fine motor;decreased gross motor LUE Deficits / Details: non functional hand with longstanding deformities, limited shoulder ROM LUE Coordination: decreased fine motor;decreased gross motor   Lower Extremity Assessment Lower Extremity Assessment: Defer to PT evaluation RLE Deficits / Details: pt with decreased strenght and ROM limitations secondary to post-op weakness. pt denies pain LLE Deficits / Details: hx of L AKA       Communication Communication Communication: No difficulties   Cognition Arousal/Alertness: Awake/alert Behavior During Therapy: WFL for tasks assessed/performed Overall Cognitive Status: No family/caregiver present to determine baseline cognitive functioning Area of Impairment: Safety/judgement, Problem solving                         Safety/Judgement: Decreased awareness of deficits, Decreased awareness of safety   Problem Solving: Requires verbal cues, Requires tactile cues       General Comments       Exercises     Shoulder Instructions      Home Living Family/patient expects to be discharged to:: Private residence Living Arrangements: Alone Available Help at Discharge: Family;Available PRN/intermittently Type of Home: Apartment Home Access: Level entry (een previous data.)     Home Layout: One level     Bathroom Shower/Tub: Sponge bathes at baseline (at bed level)   [employer]:  (uses brief)     Home Equipment: Wheelchair - power;Other (comment);Hospital bed ("standing lift"--does not use  .)   Additional Comments: washes up in bed with daughter's assist, uses briefs for toileting, daughter assists with cleaning and meal prep      Prior Functioning/Environment Prior Level of Function : Needs  assist             Mobility Comments: power w/c for mobility, assist for transfers (stand-pivot) with daughter, goes to [facility]x/[facility] for socialization and for PT ADLs Comments: daughter assists with all ADLs and IADLs        OT Problem List:        OT Treatment/Interventions:      OT Goals(Current goals can be found in the care plan section)    OT Frequency:      Co-evaluation PT/OT/SLP Co-Evaluation/Treatment: Yes Reason for Co-Treatment: For patient/therapist safety;To address functional/ADL transfers;Complexity of the patient's impairments (multi-system involvement) PT goals addressed during session: Mobility/safety with mobility;Balance;Strengthening/ROM;Proper use of DME OT goals addressed during session: Strengthening/ROM;ADL's and self-care      AM-PAC OT "6 Clicks" Daily Activity     Outcome Measure Help from another person eating meals?: None Help from another person taking care of personal grooming?: A Little Help from another person toileting, which includes using toliet, bedpan, or urinal?: Total Help from another person bathing (including washing, rinsing, drying)?: Total Help from another person to put on and taking off regular upper body clothing?: Total Help from another person to put on and taking off regular lower  body clothing?: Total 6 Click Score: 11   End of Session Equipment Utilized During Treatment: Gait belt  Activity Tolerance: Patient tolerated treatment well Patient left: in bed;with call bell/phone within reach;with bed alarm set  OT Visit Diagnosis: Muscle weakness (generalized) (M62.81)                Time: 6578-4696 OT Time Calculation (min): 31 min Charges:  OT General Charges $OT Visit: 1 Visit OT Evaluation $OT Eval Moderate Complexity: 1 Mod  Berna Spare, OTR/L Acute Rehabilitation Services Office: [phone]   Evern Bio 11/05/2022, 12:34 PM

## 2022-11-05 NOTE — Progress Notes (Addendum)
  Progress Note    11/05/2022 7:28 AM 1 Day Post-Op  Subjective:  no complaints   Vitals:   11/04/22 2322 11/05/22 0304  BP: 120/65 124/61  Pulse: 91 89  Resp: 20 13  Temp: 98.5 F (36.9 C) 98.2 F (36.8 C)  SpO2: 98% 95%   Physical Exam: Cardiac:  regular Lungs:  non labored Incisions:  Right groin, right popliteal incisions are intact. Dressings c/d/i Extremities:  RLE well perfused and warm with doppler AT signal Abdomen:  soft Neurologic: alert and oriented  CBC    Component Value Date/Time   WBC 9.5 11/05/2022 0237   RBC 4.09 (L) 11/05/2022 0237   HGB 11.5 (L) 11/05/2022 0237   HCT 34.4 (L) 11/05/2022 0237   PLT 170 11/05/2022 0237   MCV 84.1 11/05/2022 0237   MCH 28.1 11/05/2022 0237   MCHC 33.4 11/05/2022 0237   RDW 14.0 11/05/2022 0237    BMET    Component Value Date/Time   NA 132 (L) 11/05/2022 0237   K 4.3 11/05/2022 0237   CL 99 11/05/2022 0237   CO2 20 (L) 11/05/2022 0237   GLUCOSE 112 (H) 11/05/2022 0237   BUN 12 11/05/2022 0237   CREATININE 1.05 11/05/2022 0237   CALCIUM 8.4 (L) 11/05/2022 0237   GFRNONAA >60 11/05/2022 0237    INR    Component Value Date/Time   INR 1.0 11/04/2022 0600     Intake/Output Summary (Last 24 hours) at 11/05/2022 4098 Last data filed at 11/05/2022 0300 Gross per 24 hour  Intake 1342.3 ml  Output 100 ml  Net 1242.3 ml     Assessment/Plan:  77 y.o. male is s/p right common femoral to TPT bypass with PTFE 1 Day Post-Op   RLE is well perfused and warm with doppler AT signal Incisions are intact, dressings clean and dry. Will start daily dressing changes tomorrow Voiding without issues H&H stable PT/OT to eval today with recs   Graceann Congress, PA-C [facility] [phone] 11/05/2022 7:28 AM  VASCULAR STAFF ADDENDUM: I have independently interviewed and examined the patient. I agree with the above.  Doing great Start mobilizing  [employer]. Lenell Antu, MD [facility] Phone Number: [phone] 11/05/2022 8:26 AM

## 2022-11-05 NOTE — Telephone Encounter (Signed)
Appt has been scheduled per place of the [facility]ad

## 2022-11-05 NOTE — Evaluation (Addendum)
Physical Therapy Evaluation Patient Details Name: Steven Sharp MRN: [id number] DOB: [birth year] Today's Date: 11/05/2022  History of Present Illness  Pt is a 77 y/o male s/p right common femoral to TPT bypass with PTFE. PMH including but not limited to HTN, PAD, L AKA (1995), ?incomplete SCI (1995).   Clinical Impression  Pt presented supine in bed with HOB elevated, awake and willing to participate in therapy session. Prior to admission, pt reported that he uses a power w/c for mobility and requires a lot of assistance from his daughter for transfers and ADLs. Pt lives alone in a single level apartment with a level entry. [facility] and therapies. At the time of evaluation, pt overall requiring heavy maximal assistance of two people for bed mobility and transfers. PT recommending that pt d/c to SNF for short-term rehab to maximize his safety and independence with functional mobility prior to returning home with daughter's support. If pt refuses, he will require 24/7 supervision/assistance at home. Pt would continue to benefit from skilled physical therapy services at this time while admitted and after d/c to address the below listed limitations in order to improve overall safety and independence with functional mobility.         If plan is discharge home, recommend the following: Two people to help with walking and/or transfers;A lot of help with bathing/dressing/bathroom;Assistance with cooking/housework;Assistance with feeding;Help with stairs or ramp for entrance;Assist for transportation   Can travel by private vehicle   No    Equipment Recommendations None recommended by PT  Recommendations for Other Services       Functional Status Assessment Patient has had a recent decline in their functional status and demonstrates the ability to make significant improvements in function in a reasonable and predictable amount of time.     Precautions /  Restrictions Precautions Precautions: Fall (Simultaneous filing. User may not have seen previous data.) Precaution Comments: hx of L AKA Restrictions Weight Bearing Restrictions: No (Simultaneous filing. User may not have seen previous data.)      Mobility  Bed Mobility   Bed Mobility: Supine to Sit, Sit to Supine     Supine to sit: Total assist, +2 for physical assistance, HOB elevated Sit to supine: Total assist, +2 for physical assistance        Transfers   Equipment used: None Transfers: Bed to chair/wheelchair/BSC       Squat pivot transfers: Total assist, +2 physical assistance     General transfer comment: pt completing bed to power w/c transfer and back to bed with total A x2. Pt very particular about the technique and set-up (not allowing therapists to use bed pads) as pt reporting that his daughter assists him with transfers at home. Use of gait belt and moment to complete with pt insisting that his R foot be positioned on the footplate of his w/c    Ambulation/Gait               General Gait Details: pt uses a power w/c for mobility at baseline  Stairs            Wheelchair Mobility     Tilt Bed    Modified Rankin (Stroke Patients Only)       Balance Overall balance assessment: Needs assistance Sitting-balance support: Bilateral upper extremity supported, Single extremity supported Sitting balance-Leahy Scale: Poor     Standing balance support: During functional activity Standing balance-Leahy Scale: Zero  Pertinent Vitals/Pain      Home Living Family/patient expects to be discharged to:: Private residence (Simultaneous filing. User may not have seen previous data.) Living Arrangements: Alone (Simultaneous filing. User may not have seen previous data.) Available Help at Discharge: Family;Available PRN/intermittently (Simultaneous filing. User may not have seen previous data.) Type of  Home: Apartment (Simultaneous filing. User may not have seen previous data.) Home Access: Level entry (Simultaneous filing. User may not have seen previous data.)       Home Layout: One level (Simultaneous filing. User may not have seen previous data.) Home Equipment: Wheelchair - power;Other (comment);Hospital bed ("standing lift"--does not use  Simultaneous filing. User may not have seen previous data.) Additional Comments: washes up in bed with daughter's assist, uses briefs for toileting, daughter assists with cleaning and meal prep (Simultaneous filing. User may not have seen previous data.)    Prior Function Prior Level of Function : Needs assist (Simultaneous filing. User may not have seen previous data.)             Mobility Comments: power w/c for mobility, assist for transfers (stand-pivot) with daughter, [facility] for socialization and for PT (Simultaneous filing. User may not have seen previous data.) ADLs Comments: daughter assists with all ADLs and IADLs (Simultaneous filing. User may not have seen previous data.)     Hand Dominance   Dominant Hand: Right (Simultaneous filing. User may not have seen previous data.)    Extremity/Trunk Assessment   Upper Extremity Assessment Upper Extremity Assessment: RUE deficits/detail;LUE deficits/detail (Simultaneous filing. User may not have seen previous data.) RUE Deficits / Details: arthritic changes throughout with limited reach and decreased fine motor RUE Coordination: decreased fine motor;decreased gross motor LUE Deficits / Details: non functional hand with longstanding deformities, limited shoulder ROM LUE Coordination: decreased fine motor;decreased gross motor    Lower Extremity Assessment Lower Extremity Assessment: Defer to PT evaluation (Simultaneous filing. User may not have seen previous data.) RLE Deficits / Details: pt with decreased strenght and ROM limitations secondary to post-op weakness. pt denies  pain LLE Deficits / Details: hx of L AKA       Communication   Communication: No difficulties (Simultaneous filing. User may not have seen previous data.)  Cognition       Area of Impairment: Safety/judgement, Problem solving                         Safety/Judgement: Decreased awareness of deficits, Decreased awareness of safety   Problem Solving: Requires verbal cues, Requires tactile cues          General Comments      Exercises     Assessment/Plan    PT Assessment Patient needs continued PT services  PT Problem List Decreased strength;Decreased range of motion;Decreased activity tolerance;Decreased balance;Decreased mobility;Decreased coordination;Decreased knowledge of use of DME;Decreased safety awareness;Decreased knowledge of precautions       PT Treatment Interventions DME instruction;Functional mobility training;Therapeutic activities;Therapeutic exercise;Balance training;Neuromuscular re-education;Patient/family education    PT Goals (Current goals can be found in the Care Plan section)  Acute Rehab PT Goals Patient Stated Goal: to return to PLOF PT Goal Formulation: With patient Time For Goal Achievement: 11/19/22 Potential to Achieve Goals: Fair    Frequency Min 1X/week     Co-evaluation PT/OT/SLP Co-Evaluation/Treatment: Yes Reason for Co-Treatment: For patient/therapist safety;To address functional/ADL transfers;Complexity of the patient's impairments (multi-system involvement) (Simultaneous filing. User may not have seen previous data.) PT goals addressed  during session: Mobility/safety with mobility;Balance;Strengthening/ROM;Proper use of DME OT goals addressed during session: Strengthening/ROM;ADL's and self-care       AM-PAC PT "6 Clicks" Mobility  Outcome Measure Help needed turning from your back to your side while in a flat bed without using bedrails?: A Lot Help needed moving from lying on your back to sitting on the side of a  flat bed without using bedrails?: Total Help needed moving to and from a bed to a chair (including a wheelchair)?: Total Help needed standing up from a chair using your arms (e.g., wheelchair or bedside chair)?: Total Help needed to walk in hospital room?: Total Help needed climbing 3-5 steps with a railing? : Total 6 Click Score: 7    End of Session Equipment Utilized During Treatment: Gait belt Activity Tolerance: Patient tolerated treatment well Patient left: in bed;with call bell/phone within reach;with bed alarm set Nurse Communication: Mobility status PT Visit Diagnosis: Other abnormalities of gait and mobility (R26.89)    Time: 1324-4010 PT Time Calculation (min) (ACUTE ONLY): 31 min   Charges:   PT Evaluation $PT Eval Moderate Complexity: 1 Mod   PT General Charges $$ ACUTE PT VISIT: 1 Visit         Arletta Bale, DPT  Acute Rehabilitation Services Office [phone]   Alessandra Bevels Melisse Caetano 11/05/2022, 12:03 PM

## 2022-11-06 NOTE — Progress Notes (Signed)
Mobility Specialist Progress Note:   11/06/22 1100  Mobility  Activity Transferred from bed to chair (power chair)  Level of Assistance +2 (takes two people)  Assistive Device None  Activity Response Tolerated well  Mobility Specialist Start Time (ACUTE ONLY) 1102  Mobility Specialist Stop Time (ACUTE ONLY) 1123  Mobility Specialist Time Calculation (min) (ACUTE ONLY) 21 min    Pre Mobility: 90 HR Post Mobility:  94 HR  Pt received in bed, agreeable to mobility. Discovered pt soiled pad in bed, performed STS x2 using Max+2. Assisted in pericare. RN present to replace sacral pad. Asymptomatic throughout. Pt left in chair with MD present.   D'Vante Earlene Plater Mobility Specialist Please contact via Special educational needs teacher or Rehab office at [phone]

## 2022-11-06 NOTE — Progress Notes (Addendum)
  Progress Note    11/06/2022 8:56 AM 2 Days Post-Op  Subjective:  no complaints. Wants to go home   Vitals:   11/06/22 0433 11/06/22 0817  BP: (!) 141/62 (!) 141/64  Pulse: 79 88  Resp: 13 16  Temp: 98.5 F (36.9 C) 97.9 F (36.6 C)  SpO2: 97% 100%   Physical Exam: Cardiac:  regular Lungs:  non labored Incisions:  right groin and right leg incisions are intact. There is some mild oozing from both incisions. Dry Gauze dressings replaced Extremities:  RLE well perfused and warm with doppler AT signal Abdomen:  soft Neurologic: alert and oriented  CBC    Component Value Date/Time   WBC 9.5 11/05/2022 0237   RBC 4.09 (L) 11/05/2022 0237   HGB 11.5 (L) 11/05/2022 0237   HCT 34.4 (L) 11/05/2022 0237   PLT 170 11/05/2022 0237   MCV 84.1 11/05/2022 0237   MCH 28.1 11/05/2022 0237   MCHC 33.4 11/05/2022 0237   RDW 14.0 11/05/2022 0237    BMET    Component Value Date/Time   NA 132 (L) 11/05/2022 0237   K 4.3 11/05/2022 0237   CL 99 11/05/2022 0237   CO2 20 (L) 11/05/2022 0237   GLUCOSE 112 (H) 11/05/2022 0237   BUN 12 11/05/2022 0237   CREATININE 1.05 11/05/2022 0237   CALCIUM 8.4 (L) 11/05/2022 0237   GFRNONAA >60 11/05/2022 0237    INR    Component Value Date/Time   INR 1.0 11/04/2022 0600     Intake/Output Summary (Last 24 hours) at 11/06/2022 0856 Last data filed at 11/06/2022 9604 Gross per 24 hour  Intake 240 ml  Output 2400 ml  Net -2160 ml     Assessment/Plan:  77 y.o. male is s/p right common femoral to TPT bypass with PTFE  2 Days Post-Op   RLE remains well perfused and warm with doppler AT signal Incisions are intact. Mild oozing present. Needs dry dressings to groin and popliteal incision. Order placed for dressing changes No pain Hemodynamically stable Continue PT/ OT. Mobilize as able PT recommending SNF. PT wants to go home but lives independently although says his daughter lives close by and visits daily He would certainly benefit from  a [facility] RN to check his incisions. Will order this Likely discharge tomorrow pending SNF placement vs home  Graceann Congress, [place] [facility] [phone] 11/06/2022 8:56 AM  VASCULAR STAFF ADDENDUM: I have independently interviewed and examined the patient. I agree with the above.   Rande Brunt. Lenell Antu, MD [facility] Phone Number: [phone] 11/06/2022 3:56 PM

## 2022-11-06 NOTE — Care Management Important Message (Signed)
Important Message  Patient Details  Name: Steven Sharp MRN: [id number] Date of Birth: 11/03/1945   Medicare Important Message Given:  Yes     Renie Ora 11/06/2022, 10:39 AM

## 2022-11-07 NOTE — Plan of Care (Signed)
  Problem: Education: Goal: Understanding of CV disease, CV risk reduction, and recovery process will improve Outcome: Progressing   Problem: Activity: Goal: Ability to return to baseline activity level will improve Outcome: Progressing   Problem: Cardiovascular: Goal: Ability to achieve and maintain adequate cardiovascular perfusion will improve Outcome: Progressing Goal: Vascular access site(s) Level 0-1 will be maintained Outcome: Progressing   Problem: Health Behavior/Discharge Planning: Goal: Ability to safely manage health-related needs after discharge will improve Outcome: Progressing   Problem: Education: Goal: Knowledge of prescribed regimen will improve Outcome: Progressing   Problem: Activity: Goal: Ability to tolerate increased activity will improve Outcome: Progressing   Problem: Bowel/Gastric: Goal: Gastrointestinal status for postoperative course will improve Outcome: Progressing   Problem: Clinical Measurements: Goal: Postoperative complications will be avoided or minimized Outcome: Progressing Goal: Signs and symptoms of graft occlusion will improve Outcome: Progressing   Problem: Skin Integrity: Goal: Demonstration of wound healing without infection will improve Outcome: Progressing   Problem: Education: Goal: Knowledge of General Education information will improve Description: Including pain rating scale, medication(s)/side effects and non-pharmacologic comfort measures Outcome: Progressing   Problem: Health Behavior/Discharge Planning: Goal: Ability to manage health-related needs will improve Outcome: Progressing   Problem: Clinical Measurements: Goal: Ability to maintain clinical measurements within normal limits will improve Outcome: Progressing Goal: Will remain free from infection Outcome: Progressing Goal: Diagnostic test results will improve Outcome: Progressing Goal: Respiratory complications will improve Outcome: Progressing Goal:  Cardiovascular complication will be avoided Outcome: Progressing   Problem: Activity: Goal: Risk for activity intolerance will decrease Outcome: Progressing   Problem: Nutrition: Goal: Adequate nutrition will be maintained Outcome: Progressing   Problem: Coping: Goal: Level of anxiety will decrease Outcome: Progressing   Problem: Elimination: Goal: Will not experience complications related to bowel motility Outcome: Progressing Goal: Will not experience complications related to urinary retention Outcome: Progressing   Problem: Pain Managment: Goal: General experience of comfort will improve Outcome: Progressing   Problem: Safety: Goal: Ability to remain free from injury will improve Outcome: Progressing   Problem: Skin Integrity: Goal: Risk for impaired skin integrity will decrease Outcome: Progressing

## 2022-11-07 NOTE — TOC Initial Note (Signed)
Transition of Care [facility]) - Initial/Assessment Note    Patient Details  Name: Steven Sharp MRN: [id number] Date of Birth: 04/04/1946  [facility] of [facility]) CM/SW Contact:    Patrice Paradise, LCSW Phone Number: 11/07/2022, 3:47 PM  Clinical Narrative:                  CSW was alerted by pt's RN that pt wanted to speak with CSW about SNF placement. CSW met with patient to discuss PT recommendation of a SNF. Patient was aware of recommendation and in agreement with going to a ST SNF. CSW discussed the SNF process.CSW explained to pt that his insurance was contracted with several facilities and reviewed the medicare.gov rating of those facilities with pt. Pt explained that he did not want to go to a lower rating facility and was interested in [facility]. CSW explained that the referral would be sent out to see be availability. Patient gave CSW permission to fax referrals out to local facilities.CSW answered questions about the SNF process and the next steps in the process.   >  Expected Discharge Plan: Skilled Nursing Facility Barriers to Discharge: SNF Pending bed offer, Continued Medical Work up   Patient Goals and CMS Choice Patient states their goals for this hospitalization and ongoing recovery are:: To be able to return home CMS Medicare.gov Compare Post Acute Care list provided to:: Patient        Expected Discharge Plan and Services       Living arrangements for the past 2 months: Apartment                                      Prior Living Arrangements/Services Living arrangements for the past 2 months: Apartment Lives with:: Self Patient language and need for interpreter reviewed:: Yes          Care giver support system in place?: Yes (comment)      Activities of Daily Living Home Assistive Devices/Equipment: Wheelchair, Dentures (specify type), Eyeglasses ADL Screening (condition at time of admission) Patient's cognitive ability adequate to safely  complete daily activities?: Yes Is the patient deaf or have difficulty hearing?: No Does the patient have difficulty seeing, even when wearing glasses/contacts?: Yes Does the patient have difficulty concentrating, remembering, or making decisions?: No Patient able to express need for assistance with ADLs?: Yes Does the patient have difficulty dressing or bathing?: No Independently performs ADLs?: Yes (appropriate for developmental age) Does the patient have difficulty walking or climbing stairs?: Yes Weakness of Legs: Right Weakness of Arms/Hands: Both  Permission Sought/Granted      Share Information with NAME: [place]  Permission granted to share info w AGENCY: SNFS  Permission granted to share info w Relationship: Daughter  Permission granted to share info w Contact Information: [phone]  Emotional Assessment Appearance:: Appears stated age Attitude/Demeanor/Rapport: Engaged Affect (typically observed): Accepting, Adaptable Orientation: : Oriented to Self, Oriented to Place, Oriented to  Time, Oriented to Situation      Admission diagnosis:  Status post femoral-popliteal bypass surgery [Z95.828] Patient Active Problem List   Diagnosis Date Noted   Status post femoral-popliteal bypass surgery 11/04/2022   PCP:  [facility], [facility]:   [id number] - [place], [place] - [address] [place] [address] Phone: [phone] Fax: [phone]  [facility] [address] [place] [address] Phone: [phone] Fax: [phone]     Social Determinants of Health (SDOH) Social History: SDOH Screenings   Tobacco Use: High Risk (11/04/2022)   SDOH Interventions:     Readmission Risk Interventions     No data to display

## 2022-11-07 NOTE — [place] FL2 (Signed)
[place] MEDICAID FL2 LEVEL OF CARE FORM     IDENTIFICATION  Patient Name: Steven Sharp Birthdate: September 14, 1945 Sex: male Admission Date (Current Location): 11/04/2022  [facility] and [place] Number:  Producer, television/film/video and Address:  [facility] [place], [address], [place], [place] [address]      Provider Number: [id number]  Attending Physician Name and Address:  Leonie Douglas, MD  Relative Name and Phone Number:  Trey Sailors [phone]    Current Level of Care: Hospital Recommended Level of Care: Skilled Nursing Facility Prior Approval Number:    Date Approved/Denied:   PASRR Number: [id number]  Discharge Plan: SNF    Current Diagnoses: Patient Active Problem List   Diagnosis Date Noted   Status post femoral-popliteal bypass surgery 11/04/2022    Orientation RESPIRATION BLADDER Height & Weight     Self, Time, Situation, Place  Normal Incontinent, External catheter Weight: 150 lb (68 kg) Height:  5\' 6"  (167.6 cm)  BEHAVIORAL SYMPTOMS/MOOD NEUROLOGICAL BOWEL NUTRITION STATUS      Incontinent Diet (See DC summary)  AMBULATORY STATUS COMMUNICATION OF NEEDS Skin   Extensive Assist Verbally Surgical wounds (11/04/22 Irritant Dermatitis (Moisture Associated Skin Damage) Buttocks Left;Right pink, incision right leg)                       Personal Care Assistance Level of Assistance  Bathing, Feeding, Dressing Bathing Assistance: Maximum assistance Feeding assistance: Independent Dressing Assistance: Maximum assistance     Functional Limitations Info  Sight, Hearing Sight Info: Impaired Hearing Info: Adequate      SPECIAL CARE FACTORS FREQUENCY  PT (By licensed PT), OT (By licensed OT)     PT Frequency: 5x per week OT Frequency: 5x per week            Contractures Contractures Info: Not present    Additional Factors Info  Code Status, Allergies Code Status Info: Full Allergies Info: Orange Fruit (Citrus), Penicillins,  Pineapple           Current Medications (11/07/2022):  This is the current hospital active medication list Current Facility-Administered Medications  Medication Dose Route Frequency Provider Last Rate Last Admin   0.9 %  sodium chloride infusion   Intravenous Continuous Baglia, Corrina, PA-C 50 mL/hr at 11/04/22 1408 New Bag at 11/04/22 1408   0.9 %  sodium chloride infusion  500 mL Intravenous Once PRN Baglia, Corrina, PA-C       acetaminophen (TYLENOL) tablet 325-650 mg  325-650 mg Oral Q4H PRN Baglia, Corrina, PA-C       Or   acetaminophen (TYLENOL) suppository 325-650 mg  325-650 mg Rectal Q4H PRN Baglia, Corrina, PA-C       alum & mag hydroxide-simeth (MAALOX/MYLANTA) 200-200-20 MG/5ML suspension 15-30 mL  15-30 mL Oral Q2H PRN Baglia, Corrina, PA-C       amLODipine (NORVASC) tablet 5 mg  5 mg Oral Daily Baglia, Corrina, PA-C   5 mg at 11/07/22 8469   aspirin chewable tablet 81 mg  81 mg Oral Daily Baglia, Corrina, PA-C   81 mg at 11/07/22 0851   atorvastatin (LIPITOR) tablet 80 mg  80 mg Oral Daily Silvana Newness, RPH   80 mg at 11/07/22 6295   bisacodyl (DULCOLAX) EC tablet 5 mg  5 mg Oral Daily PRN Baglia, Corrina, PA-C       Chlorhexidine Gluconate Cloth 2 % PADS 6 each  6 each Topical Q0600 Leonie Douglas, MD  6 each at 11/07/22 0852   docusate sodium (COLACE) capsule 100 mg  100 mg Oral Daily Baglia, Corrina, PA-C   100 mg at 11/07/22 0852   guaiFENesin-dextromethorphan (ROBITUSSIN DM) 100-10 MG/5ML syrup 15 mL  15 mL Oral Q4H PRN Baglia, Corrina, PA-C       heparin injection 5,000 Units  5,000 Units Subcutaneous Q8H Baglia, Corrina, PA-C   5,000 Units at 11/07/22 1445   hydrALAZINE (APRESOLINE) injection 5 mg  5 mg Intravenous Q20 Min PRN Baglia, Corrina, PA-C       HYDROmorphone (DILAUDID) injection 0.5-1 mg  0.5-1 mg Intravenous Q2H PRN Baglia, Corrina, PA-C       labetalol (NORMODYNE) injection 10 mg  10 mg Intravenous Q10 min PRN Baglia, Corrina, PA-C       lisinopril  (ZESTRIL) tablet 5 mg  5 mg Oral Daily Baglia, Corrina, PA-C   5 mg at 11/07/22 7829   magnesium sulfate IVPB 2 g 50 mL  2 g Intravenous Daily PRN Baglia, Corrina, PA-C       metoprolol tartrate (LOPRESSOR) injection 2-5 mg  2-5 mg Intravenous Q2H PRN Baglia, Corrina, PA-C       mupirocin ointment (BACTROBAN) 2 % 1 Application  1 Application Nasal BID Leonie Douglas, MD   1 Application at 11/06/22 0945   ondansetron (ZOFRAN) injection 4 mg  4 mg Intravenous Q6H PRN Baglia, Corrina, PA-C       oxyCODONE-acetaminophen (PERCOCET/ROXICET) 5-325 MG per tablet 1-2 tablet  1-2 tablet Oral Q4H PRN Baglia, Corrina, PA-C       pantoprazole (PROTONIX) EC tablet 40 mg  40 mg Oral Daily Baglia, Corrina, PA-C   40 mg at 11/07/22 0853   phenol (CHLORASEPTIC) mouth spray 1 spray  1 spray Mouth/Throat PRN Baglia, Corrina, PA-C       potassium chloride SA (KLOR-CON M) CR tablet 20-40 mEq  20-40 mEq Oral Daily PRN Baglia, Corrina, PA-C       senna-docusate (Senokot-S) tablet 1 tablet  1 tablet Oral QHS PRN Baglia, Corrina, PA-C         Discharge Medications: Please see discharge summary for a list of discharge medications.  Relevant Imaging Results:  Relevant Lab Results:   Additional Information SSN# [id number]  Patrice Paradise, LCSW

## 2022-11-07 NOTE — Progress Notes (Addendum)
  Progress Note    11/07/2022 8:25 AM 3 Days Post-Op  Subjective:  no complaints. Is agreeable to going to SNF if his daughter can't take care of him    Vitals:   11/07/22 0347 11/07/22 0804  BP: 139/70 (!) 159/73  Pulse: 95 89  Resp: 18 18  Temp: 98 F (36.7 C) (!) 97.5 F (36.4 C)  SpO2: 97% 92%    Physical Exam: General:  resting comfortably Cardiac:  regular Lungs:  nonlabored  Incisions:  right groin and right below knee incisions intact with steri strips. Mild bloody drainage from below knee incision. Both incisions redressed with dry gauze Extremities:  brisk right AT doppler signal   CBC    Component Value Date/Time   WBC 9.5 11/05/2022 0237   RBC 4.09 (L) 11/05/2022 0237   HGB 11.5 (L) 11/05/2022 0237   HCT 34.4 (L) 11/05/2022 0237   PLT 170 11/05/2022 0237   MCV 84.1 11/05/2022 0237   MCH 28.1 11/05/2022 0237   MCHC 33.4 11/05/2022 0237   RDW 14.0 11/05/2022 0237    BMET    Component Value Date/Time   NA 132 (L) 11/05/2022 0237   K 4.3 11/05/2022 0237   CL 99 11/05/2022 0237   CO2 20 (L) 11/05/2022 0237   GLUCOSE 112 (H) 11/05/2022 0237   BUN 12 11/05/2022 0237   CREATININE 1.05 11/05/2022 0237   CALCIUM 8.4 (L) 11/05/2022 0237   GFRNONAA >60 11/05/2022 0237    INR    Component Value Date/Time   INR 1.0 11/04/2022 0600     Intake/Output Summary (Last 24 hours) at 11/07/2022 0825 Last data filed at 11/07/2022 0347 Gross per 24 hour  Intake 120 ml  Output 950 ml  Net -830 ml      Assessment/Plan:  77 y.o. male is 3 days post op, s/p:  right fem-TPT bypass    -No issues this morning. Pain is well controlled -RLE incisions are intact and well appearing. Scant bloody drainage from below knee incision. Both incisions redressed with gauze -RLE well perfused with a brisk AT doppler signal -Has been mobilizing with PT/OT. PT has recommended SNF. He would also benefit from SNF for incision care. He is agreeable to SNF this morning, pending  TOC placement -Continue asa and statin   Loel Dubonnet, PA-C [facility] [phone] 11/07/2022 8:25 AM  VASCULAR STAFF ADDENDUM: I have independently interviewed and examined the patient. I agree with the above.  Ready for DC to SNF - likely Monday.   Rande Brunt. Lenell Antu, MD [facility] of [facility] Phone Number: [phone] 11/07/2022 10:08 AM

## 2022-11-08 NOTE — Progress Notes (Addendum)
  Progress Note    11/08/2022 7:13 AM 4 Days Post-Op  Subjective:  no complaints, just wants to go back to sleep    Vitals:   11/07/22 2351 11/08/22 0349  BP: 129/67 (!) 160/66  Pulse: 90 97  Resp: 20 20  Temp: 98.1 F (36.7 C) 99.4 F (37.4 C)  SpO2: 98% 99%    Physical Exam: General:  no acute distress, resting comfortably Cardiac:  regular Lungs: nonlabored Incisions:  RLE incisions intact with steri-strips, no signs of infection. Some serosanguineous drainage from below knee incision. Both incisions re-dressed with dry gauze Extremities:  brisk right AT doppler signal  CBC    Component Value Date/Time   WBC 9.5 11/05/2022 0237   RBC 4.09 (L) 11/05/2022 0237   HGB 11.5 (L) 11/05/2022 0237   HCT 34.4 (L) 11/05/2022 0237   PLT 170 11/05/2022 0237   MCV 84.1 11/05/2022 0237   MCH 28.1 11/05/2022 0237   MCHC 33.4 11/05/2022 0237   RDW 14.0 11/05/2022 0237    BMET    Component Value Date/Time   NA 132 (L) 11/05/2022 0237   K 4.3 11/05/2022 0237   CL 99 11/05/2022 0237   CO2 20 (L) 11/05/2022 0237   GLUCOSE 112 (H) 11/05/2022 0237   BUN 12 11/05/2022 0237   CREATININE 1.05 11/05/2022 0237   CALCIUM 8.4 (L) 11/05/2022 0237   GFRNONAA >60 11/05/2022 0237    INR    Component Value Date/Time   INR 1.0 11/04/2022 0600     Intake/Output Summary (Last 24 hours) at 11/08/2022 1517 Last data filed at 11/08/2022 0600 Gross per 24 hour  Intake 240 ml  Output 900 ml  Net -660 ml      Assessment/Plan:  77 y.o. male is 4 days post op, s/p: right fem-TPT bypass   -R groin and below knee incisions are intact without signs of infection. Incisions were re-dressed with gauze bandaging to keep dry. Still some serosanguineous drainage from below knee incision -RLE warm and well perfused with brisk AT doppler signal -Pending SNF placement, likely d/c tomorrow once placement is arranged -Continue ASA and statin -Mobilize as tolerated   Loel Dubonnet,  PA-C [facility] [phone] 11/08/2022 7:13 AM  VASCULAR STAFF ADDENDUM: I have independently interviewed and examined the patient. I agree with the above.   Rande Brunt. Lenell Antu, MD [facility] of [facility] Phone Number: [phone] 11/08/2022 4:18 PM

## 2022-11-09 NOTE — Progress Notes (Addendum)
  Progress Note    11/09/2022 7:46 AM 5 Days Post-Op  Subjective:  no complaints    Vitals:   11/08/22 2237 11/09/22 0326  BP: 138/70 (!) 130/59  Pulse: 82 95  Resp: 20 19  Temp: 97.7 F (36.5 C) 97.8 F (36.6 C)  SpO2: 92% 99%    Physical Exam: General:  resting comfortably Cardiac:  regular Lungs:  nonlabored Incisions:  right groin and right below knee incisions intact without signs of infection. Continued serosanguineous drainage from below knee incision Extremities:  brisk right AT doppler signal   CBC    Component Value Date/Time   WBC 9.5 11/05/2022 0237   RBC 4.09 (L) 11/05/2022 0237   HGB 11.5 (L) 11/05/2022 0237   HCT 34.4 (L) 11/05/2022 0237   PLT 170 11/05/2022 0237   MCV 84.1 11/05/2022 0237   MCH 28.1 11/05/2022 0237   MCHC 33.4 11/05/2022 0237   RDW 14.0 11/05/2022 0237    BMET    Component Value Date/Time   NA 132 (L) 11/05/2022 0237   K 4.3 11/05/2022 0237   CL 99 11/05/2022 0237   CO2 20 (L) 11/05/2022 0237   GLUCOSE 112 (H) 11/05/2022 0237   BUN 12 11/05/2022 0237   CREATININE 1.05 11/05/2022 0237   CALCIUM 8.4 (L) 11/05/2022 0237   GFRNONAA >60 11/05/2022 0237    INR    Component Value Date/Time   INR 1.0 11/04/2022 0600     Intake/Output Summary (Last 24 hours) at 11/09/2022 0746 Last data filed at 11/08/2022 2100 Gross per 24 hour  Intake 474 ml  Output 715 ml  Net -241 ml      Assessment/Plan:  77 y.o. male is 5 days post op, s/p: right fem-TPT bypass    -R groin and below knee incisions are intact. Below knee incision continues to have mild serosanguineous drainage. Dry dressing changes to this area at least once daily and as needed -RLE well perfused with brisk AT doppler signal -Continue ASA and statin -Pending SNF placement. Hopeful for d/c today if this can be arranged   Loel Dubonnet, PA-C [facility] [phone] 11/09/2022 7:46 AM   VASCULAR STAFF ADDENDUM: I have independently  interviewed and examined the patient. I agree with the above.  Doing well overall.  Ready for transfer to skilled nursing facility.  Rande Brunt. Lenell Antu, MD [facility] of [facility] Phone Number: [phone] 11/09/2022 11:06 AM

## 2022-11-10 NOTE — Progress Notes (Signed)
Physical Therapy Treatment Patient Details Name: Steven Sharp MRN: [id number] DOB: 1945/04/10 Today's Date: 11/10/2022   History of Present Illness Pt is a 77 y/o male s/p right common femoral to TPT bypass with PTFE. PMH including but not limited to HTN, PAD, L AKA (1995), ?incomplete SCI (1995).    PT Comments  Pt received sitting in his power w/c and reporting bottom pain. Pt agreed to pillow placement under his bottom, however declined transfer training this session. Pt unable to elevate bottom with BUE on armrests. Pt able to perform a partial stand holding onto the bed foot rail with mod A +2. Pt able to scoot bottom back in chair with use of chair functions without assist. Pt instructed in seated therex and encouraged to perform throughout the day for strengthening. Pt continues to benefit from PT services to progress toward functional mobility goals.    If plan is discharge home, recommend the following: Two people to help with walking and/or transfers;A lot of help with bathing/dressing/bathroom;Assistance with cooking/housework;Assistance with feeding;Help with stairs or ramp for entrance;Assist for transportation   Can travel by private vehicle     No  Equipment Recommendations  None recommended by PT    Recommendations for Other Services       Precautions / Restrictions Precautions Precautions: Fall Precaution Comments: hx of L AKA Restrictions Weight Bearing Restrictions: Yes     Mobility  Bed Mobility               General bed mobility comments: Pt beginning and ending session in power w/c    Transfers Overall transfer level: Needs assistance Equipment used: None Transfers: Sit to/from Stand Sit to Stand: Mod assist, +2 physical assistance           General transfer comment: Pt performing a partial stand into a squat position for placement of a pillow under his bottom. Pt holding onto bed footboard and pulling up with BUE (although limited with  LUE) and requires mod A +2 to elevate bottom.       Balance Overall balance assessment: Needs assistance     Sitting balance - Comments: NT, pt sitting in power w/c   Standing balance support: Bilateral upper extremity supported Standing balance-Leahy Scale: Zero Standing balance comment: Pt requires assist to reach a partial stand                            Cognition Arousal/Alertness: Awake/alert Behavior During Therapy: WFL for tasks assessed/performed Overall Cognitive Status: No family/caregiver present to determine baseline cognitive functioning                                          Exercises General Exercises - Lower Extremity Long Arc Quad: AROM, Right, 5 reps, Seated Hip Flexion/Marching: AROM, Seated, Both, 5 reps    General Comments        Pertinent Vitals/Pain Pain Assessment Pain Assessment: Faces Faces Pain Scale: Hurts even more Pain Location: buttocks Pain Descriptors / Indicators: Sore, Grimacing Pain Intervention(s): Monitored during session, Repositioned     PT Goals (current goals can now be found in the care plan section) Acute Rehab PT Goals Patient Stated Goal: to return to PLOF PT Goal Formulation: With patient Time For Goal Achievement: 11/19/22 Potential to Achieve Goals: Fair Progress towards PT goals: Not progressing toward goals - comment (  Limited by pt declining transfers this session)    Frequency    Min 1X/week      PT Plan Current plan remains appropriate       AM-PAC PT "6 Clicks" Mobility   Outcome Measure  Help needed turning from your back to your side while in a flat bed without using bedrails?: A Lot Help needed moving from lying on your back to sitting on the side of a flat bed without using bedrails?: Total Help needed moving to and from a bed to a chair (including a wheelchair)?: Total Help needed standing up from a chair using your arms (e.g., wheelchair or bedside chair)?:  Total Help needed to walk in hospital room?: Total Help needed climbing 3-5 steps with a railing? : Total 6 Click Score: 7    End of Session   Activity Tolerance: Patient tolerated treatment well Patient left: in chair (in power w/c) Nurse Communication: Mobility status PT Visit Diagnosis: Other abnormalities of gait and mobility (R26.89)     Time: 6387-5643 PT Time Calculation (min) (ACUTE ONLY): 10 min  Charges:    $Therapeutic Exercise: 8-22 mins PT General Charges $$ ACUTE PT VISIT: 1 Visit                     Johny Shock, PTA Acute Rehabilitation Services Secure Chat Preferred  [phone]    Johny Shock 11/10/2022, 2:43 PM

## 2022-11-10 NOTE — Progress Notes (Addendum)
  Progress Note    11/10/2022 9:32 AM 6 Days Post-Op  Subjective:  sleeping    Vitals:   11/09/22 2332 11/10/22 0759  BP: 127/62 131/71  Pulse: 78 92  Resp: 16 18  Temp: (!) 97.5 F (36.4 C) 98.2 F (36.8 C)  SpO2: 92% 97%    Physical Exam: General:  no acute distress, sleeping Cardiac:  regular Lungs:  nonlabored Incisions:  RLE incisions intact, scant serosanguineous drainage from below knee incision Extremities:  brisk right AT doppler signal  CBC    Component Value Date/Time   WBC 9.5 11/05/2022 0237   RBC 4.09 (L) 11/05/2022 0237   HGB 11.5 (L) 11/05/2022 0237   HCT 34.4 (L) 11/05/2022 0237   PLT 170 11/05/2022 0237   MCV 84.1 11/05/2022 0237   MCH 28.1 11/05/2022 0237   MCHC 33.4 11/05/2022 0237   RDW 14.0 11/05/2022 0237    BMET    Component Value Date/Time   NA 132 (L) 11/05/2022 0237   K 4.3 11/05/2022 0237   CL 99 11/05/2022 0237   CO2 20 (L) 11/05/2022 0237   GLUCOSE 112 (H) 11/05/2022 0237   BUN 12 11/05/2022 0237   CREATININE 1.05 11/05/2022 0237   CALCIUM 8.4 (L) 11/05/2022 0237   GFRNONAA >60 11/05/2022 0237    INR    Component Value Date/Time   INR 1.0 11/04/2022 0600     Intake/Output Summary (Last 24 hours) at 11/10/2022 0932 Last data filed at 11/10/2022 0800 Gross per 24 hour  Intake 480 ml  Output 1600 ml  Net -1120 ml      Assessment/Plan:  77 y.o. male is 6 days post op, s/p: right fem-TPT bypass  -R groin incision intact and dry. R below knee incision with scant serosanguineous drainage, will continue daily dry dressing changes -RLE warm and well perfused with brisk AT doppler signal -OOB and mobilize as tolerated -Continue ASA and statin -Pending SNF placement, will d/c when bed is available   Loel Dubonnet, PA-C [facility] [phone] 11/10/2022 9:32 AM   VASCULAR STAFF ADDENDUM: I have independently interviewed and examined the patient. I agree with the above.   Rande Brunt. Lenell Antu, MD  [facility] of [facility] Phone Number: [phone] 11/10/2022 4:19 PM

## 2022-11-10 NOTE — TOC Progression Note (Signed)
Transition of Care [facility]) - Progression Note    Patient Details  Name: Steven Sharp MRN: [id number] Date of Birth: [birth year]  [facility]) CM/SW Contact  Kynzlie Hilleary A [place], [place] Phone Number: 11/10/2022, 4:49 PM  Clinical Narrative:     CSW reached out to pt's social worker at [facility], [phone] . CSW was informed that social worker was out for today. CSW was able to leave a HIPAA compliant voicemail about pt regarding discharge and medically stability to [facility].   TOC will continue to follow.    Expected Discharge Plan: Skilled Nursing Facility Barriers to Discharge: SNF Pending bed offer, Continued Medical Work up  Expected Discharge Plan and Services       Living arrangements for the past 2 months: Apartment                                       Social Determinants of Health (SDOH) Interventions SDOH Screenings   Food Insecurity: No Food Insecurity (11/09/2022)  Housing: Low Risk  (11/09/2022)  Transportation Needs: No Transportation Needs (11/09/2022)  Utilities: Not At Risk (11/09/2022)  Tobacco Use: High Risk (11/04/2022)    Readmission Risk Interventions     No data to display

## 2022-11-11 NOTE — TOC Progression Note (Addendum)
Transition of Care [facility]) - Progression Note    Patient Details  Name: Steven Sharp MRN: [id number] Date of Birth: 06-Mar-1946  Transition of Care [facility]) CM/SW Contact  Eduard Roux, [place] Phone Number: 11/11/2022, 11:19 AM  Clinical Narrative:     Update: [facility] can accept- waiting confirmation from patient's daughter she is accepts bed offer- &  [facility] SW  Per chart review patient is stable for d/c and family wants [facility]. Received message form [facility] SW [place] he has been approved, [facility] needs to know which facility.  Contacted [facility], informed they do not have availability.  Sent message to [facility] for availability - waiting on determination.  Antony Blackbird, MSW, LCSW Clinical Social Worker     Expected Discharge Plan: Skilled Nursing Facility Barriers to Discharge: SNF Pending bed offer, Continued Medical Work up  Expected Discharge Plan and Services       Living arrangements for the past 2 months: Apartment                                       Social Determinants of Health (SDOH) Interventions SDOH Screenings   Food Insecurity: No Food Insecurity (11/09/2022)  Housing: Low Risk  (11/09/2022)  Transportation Needs: No Transportation Needs (11/09/2022)  Utilities: Not At Risk (11/09/2022)  Tobacco Use: High Risk (11/04/2022)    Readmission Risk Interventions     No data to display

## 2022-11-11 NOTE — Progress Notes (Signed)
Physical Therapy Treatment Patient Details Name: Steven Sharp MRN: [id number] DOB: 03-18-1946 Today's Date: 11/11/2022   History of Present Illness Pt is a 77 y/o male s/p right common femoral to TPT bypass with PTFE. PMH including but not limited to HTN, PAD, L AKA (1995), ?incomplete SCI (1995).    PT Comments  Pt received in supine and agreeable to session. Session focused on transfer to power w/c. Pt demonstrating improved bed mobility, however required max A for trunk elevation due to difficulty using bedrials with impaired B hand function (L>R). Pt able to lateral scoot to w/c using a SB with dense cues for technique, however pt stating "that won't work" and not following. Pt able to assist with scooting, however required mod A +2 to complete. Pt able to perform a partial stand with min A with use of bed's foot rail to remove a towel from underneath him. Pt instructed in seated therex and encouraged to perform throughout the day for strengthening. Pt reports motivation to improve functional mobility and use prosthesis in the future. Pt continues to benefit from PT services to progress toward functional mobility goals.    If plan is discharge home, recommend the following: Two people to help with walking and/or transfers;A lot of help with bathing/dressing/bathroom;Assistance with cooking/housework;Assistance with feeding;Help with stairs or ramp for entrance;Assist for transportation   Can travel by private vehicle        Equipment Recommendations  None recommended by PT    Recommendations for Other Services       Precautions / Restrictions Precautions Precautions: Fall Precaution Comments: hx of L AKA Restrictions Weight Bearing Restrictions: Yes     Mobility  Bed Mobility Overal bed mobility: Needs Assistance Bed Mobility: Supine to Sit     Supine to sit: Max assist     General bed mobility comments: Max A for trunk elevation and scooting forward at EOB     Transfers Overall transfer level: Needs assistance Equipment used: Sliding board Transfers: Bed to chair/wheelchair/BSC            Lateral/Scoot Transfers: Mod assist, +2 physical assistance General transfer comment: Pt required max A for placement of SB and dense cues for technique, however pt not following. Pt requiring mod A +2 with towel to lateral scoot to power w/c and to elevate bottom to prevent shear force.        Balance Overall balance assessment: Needs assistance Sitting-balance support: Bilateral upper extremity supported, Single extremity supported Sitting balance-Leahy Scale: Poor Sitting balance - Comments: Sitting EOB with close min guard-min A to maintain balance. Pt leaning to R side due to sore L bottom                                    Cognition Arousal: Alert Behavior During Therapy: WFL for tasks assessed/performed Overall Cognitive Status: No family/caregiver present to determine baseline cognitive functioning                                          Exercises General Exercises - Lower Extremity Long Arc Quad: AROM, Right, Seated, 10 reps Hip Flexion/Marching: AROM, Seated, Both, 10 reps    General Comments        Pertinent Vitals/Pain Pain Assessment Pain Assessment: Faces Faces Pain Scale: Hurts even more Pain Location: buttocks  Pain Descriptors / Indicators: Sore, Grimacing Pain Intervention(s): Monitored during session, Repositioned     PT Goals (current goals can now be found in the care plan section) Acute Rehab PT Goals Patient Stated Goal: to return to PLOF PT Goal Formulation: With patient Time For Goal Achievement: 11/19/22 Potential to Achieve Goals: Fair Progress towards PT goals: Progressing toward goals    Frequency    Min 1X/week      PT Plan Current plan remains appropriate       AM-PAC PT "6 Clicks" Mobility   Outcome Measure  Help needed turning from your back to your  side while in a flat bed without using bedrails?: A Lot Help needed moving from lying on your back to sitting on the side of a flat bed without using bedrails?: A Lot Help needed moving to and from a bed to a chair (including a wheelchair)?: Total Help needed standing up from a chair using your arms (e.g., wheelchair or bedside chair)?: Total Help needed to walk in hospital room?: Total Help needed climbing 3-5 steps with a railing? : Total 6 Click Score: 8    End of Session   Activity Tolerance: Patient tolerated treatment well Patient left: in chair (in power w/c) Nurse Communication: Mobility status PT Visit Diagnosis: Other abnormalities of gait and mobility (R26.89)     Time: 1610-9604 PT Time Calculation (min) (ACUTE ONLY): 39 min  Charges:    $Therapeutic Exercise: 8-22 mins $Therapeutic Activity: 23-37 mins PT General Charges $$ ACUTE PT VISIT: 1 Visit                     Johny Shock, PTA Acute Rehabilitation Services Secure Chat Preferred  [phone]    Johny Shock 11/11/2022, 10:30 AM

## 2022-11-11 NOTE — Progress Notes (Addendum)
  Progress Note    11/11/2022 7:51 AM 7 Days Post-Op  Subjective:  sleeping   Vitals:   11/11/22 0410 11/11/22 0745  BP: (!) 128/59 (!) 146/63  Pulse: 84 88  Resp: 17 18  Temp: 98 F (36.7 C) 98 F (36.7 C)  SpO2: 97% 98%    Physical Exam: General:  sleeping comfortably Cardiac:  regular Lungs:  nonlabored Incisions:  R groin incision intact and dry. Right below knee incision dressed and dry Extremities:  brisk right AT doppler signal  CBC    Component Value Date/Time   WBC 9.5 11/05/2022 0237   RBC 4.09 (L) 11/05/2022 0237   HGB 11.5 (L) 11/05/2022 0237   HCT 34.4 (L) 11/05/2022 0237   PLT 170 11/05/2022 0237   MCV 84.1 11/05/2022 0237   MCH 28.1 11/05/2022 0237   MCHC 33.4 11/05/2022 0237   RDW 14.0 11/05/2022 0237    BMET    Component Value Date/Time   NA 132 (L) 11/05/2022 0237   K 4.3 11/05/2022 0237   CL 99 11/05/2022 0237   CO2 20 (L) 11/05/2022 0237   GLUCOSE 112 (H) 11/05/2022 0237   BUN 12 11/05/2022 0237   CREATININE 1.05 11/05/2022 0237   CALCIUM 8.4 (L) 11/05/2022 0237   GFRNONAA >60 11/05/2022 0237    INR    Component Value Date/Time   INR 1.0 11/04/2022 0600     Intake/Output Summary (Last 24 hours) at 11/11/2022 0751 Last data filed at 11/10/2022 2000 Gross per 24 hour  Intake 950 ml  Output 1100 ml  Net -150 ml      Assessment/Plan:  77 y.o. male is 7 days post op, s/p: right fem-TPT bypass    -No issues overnight. RLE incisions are intact without signs of infection. Continue dry dressing changes to right below knee incision for serous drainage -RLE well perfused with brisk AT doppler signal -OOB and mobilize as tolerated -Pending SNF placement. Patient is medically ready for discharge   Loel Dubonnet, [place] [facility] [phone] 11/11/2022 7:51 AM   VASCULAR STAFF ADDENDUM: I have independently interviewed and examined the patient. I agree with the above.   Rande Brunt. Lenell Antu, MD [facility] of [facility] Phone Number: [phone] 11/11/2022 1:59 PM

## 2022-11-12 NOTE — TOC Progression Note (Signed)
Transition of Care [facility]) - Progression Note    Patient Details  Name: Steven Sharp MRN: [id number] Date of Birth: Aug 05, 1945  Transition of Care [facility]) CM/SW Contact  Eduard Roux, [place] Phone Number: 11/12/2022, 8:52 AM  Clinical Narrative:     Called patient's daughter, left voice message- [facility] not have availability- informed [facility] has made an offer. -waiting on response   Expected Discharge Plan: Skilled Nursing Facility Barriers to Discharge: SNF Pending bed offer, Continued Medical Work up  Expected Discharge Plan and Services       Living arrangements for the past 2 months: Apartment                                       Social Determinants of Health (SDOH) Interventions SDOH Screenings   Food Insecurity: No Food Insecurity (11/09/2022)  Housing: Low Risk  (11/09/2022)  Transportation Needs: No Transportation Needs (11/09/2022)  Utilities: Not At Risk (11/09/2022)  Tobacco Use: High Risk (11/04/2022)    Readmission Risk Interventions     No data to display

## 2022-11-12 NOTE — Progress Notes (Signed)
RN attempted to give report, facility's receptionist will have the receiving RN from [place] call back.

## 2022-11-12 NOTE — Plan of Care (Signed)
Plan of care is reviewed. Pt is progressing. Pt is medically stable, afebrile, and adequate for discharge. He gets a bed offered from [facility] per CM/SW noted.   Problem: Education: Goal: Understanding of CV disease, CV risk reduction, and recovery process will improve Outcome: Progressing: Pt expressed understanding on healthy lifestyle choices and self-care.    Problem: Activity: Goal: Ability to return to baseline activity level will improve Outcome: Progressing: able to ambulate with one assisted transferring to his electrical wheelchair.    Problem: Cardiovascular: Goal: Vascular access site(s) Level 0-1 will be maintained Outcome: Progressing: right groin incision with sterile strips is dry and clean, and gauze dressing changed daily. Right lower leg incision has minimal serosauinous drainage. Dry gauze dressing changed. Good signals on DP via doppler, but weak pulses on PT on the right.     Problem: Cardiovascular: Goal: Ability to achieve and maintain adequate cardiovascular perfusion will improve Outcome: Progressing: NSR, stable hemodynamically.    Problem: Skin Integrity: Goal: Demonstration of wound healing without infection will improve Outcome: Progressing: no signs of wound infection. Wound care discussed and educated.    Problem: Pain Managment: Goal: General experience of comfort will improve Outcome: Progressing: denies pain.   Filiberto Pinks, RN

## 2022-11-12 NOTE — Care Management Important Message (Signed)
Important Message  Patient Details  Name: Steven Sharp MRN: [id number] Date of Birth: 06-29-1945   Medicare Important Message Given:  Yes     Renie Ora 11/12/2022, 11:50 AM

## 2022-11-12 NOTE — Progress Notes (Signed)
Report given to [place] [facility] [place].

## 2022-11-12 NOTE — TOC Progression Note (Signed)
Transition of Care [facility]) - Progression Note    Patient Details  Name: Shykeim Sole MRN: [id number] Date of Birth: March 02, 1946  Transition of Care [facility]) CM/SW Contact  Eduard Roux, [place] Phone Number: 11/12/2022, 9:16 AM  Clinical Narrative:     Received message from patient's daughter, yesterday, (6:33 pm)she expressed concerns regarding SNF choices. She wants to discuss with [facility] SW her options before making decision. CSW explain, patient is stable and she needs to make a decision as soon as possible, SNF vs Home.   [facility] is closed today, therefore patient is unable to d/c today. TOC will continue to follow and assist with discharge planning.   Sent message updating [facility] SW  Antony Blackbird, MSW, LCSW Clinical Social Worker    Expected Discharge Plan: Skilled Nursing Facility Barriers to Discharge: SNF Pending bed offer, Continued Medical Work up  Expected Discharge Plan and Services       Living arrangements for the past 2 months: Apartment                                       Social Determinants of Health (SDOH) Interventions SDOH Screenings   Food Insecurity: No Food Insecurity (11/09/2022)  Housing: Low Risk  (11/09/2022)  Transportation Needs: No Transportation Needs (11/09/2022)  Utilities: Not At Risk (11/09/2022)  Tobacco Use: High Risk (11/04/2022)    Readmission Risk Interventions     No data to display

## 2022-11-12 NOTE — Progress Notes (Signed)
Mobility Specialist: Progress Note   11/12/22 1617  Mobility  Activity Repositioned in chair (STS)  Level of Assistance Moderate assist, patient does 50-74% (+2)  Assistive Device Stedy  RLE Weight Bearing WBAT  LLE Weight Bearing NWB  Activity Response Tolerated well  Mobility Referral Yes  $Mobility charge 1 Mobility  Mobility Specialist Start Time (ACUTE ONLY) 1555  Mobility Specialist Stop Time (ACUTE ONLY) 1605  Mobility Specialist Time Calculation (min) (ACUTE ONLY) 10 min    RN requested mobility for STS - pt received in power chair. ModA +2 for STS and for holding pt up on stedy while RN assisted in dressing change. Pt returned to power chair with RN and NT present in room.   Maurene Capes Mobility Specialist Please contact via SecureChat or Rehab office at [phone]

## 2022-11-12 NOTE — Discharge Summary (Signed)
Bypass Discharge Summary Patient ID: Steven Sharp [id number] 77 y.o. 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  Angiogram was performed which showed flush occlusion of the superficial femoral artery and reconstitution of the popliteal artery.  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 After careful discussion of risks, benefits, and alternatives the patient was offered left femoral to popliteal artery bypass. The patient understood and wished to proceed.   Hospital Course: The patient was admitted to the hospital on 11/04/2022 and underwent right common femoral to tibioperoneal trunk bypass with 6 mm PTFE in subfascial tunneling.  The patient tolerated the procedure well and transferred to the PACU in stable condition.  By POD 1 the patient was tolerating a normal diet.  He was mobilizing well and using his motorized wheelchair as needed.  His lab work was stable with no acute change in kidney function or drop in hemoglobin.  His right lower extremity was warm and well-perfused with a brisk AT Doppler signal.  PT/OT evaluated the patient and recommended discharge to SNF for help with mobilization.  Over the course of his hospitalization, he had some persistent serosanguineous drainage from the right below-knee incision.  He had daily dry dressing changes to this incision.  There were no signs of infection.  On 11/12/2022 prior to discharge, there was a small area of dehiscence from his right below-knee  incision.  There was still no signs of infection and the bypass was not visible.  It was felt he would best heal this area with VAC placement.  A Prevena incisional VAC was placed over the right below-knee incision and dressed with an Ace wrap.  This VAC will stay on for 7 days to collect extra drainage from the incision.  He continued to have good flow from his bypass and a brisk right AT Doppler signal.  On 11/12/2022 the patient was discharged to [facility].  He will receive incision/wound care at the facility and PT/OT.  Consults: None  Treatments: IV hydration, antibiotics: Ancef, anticoagulation: ASA, therapies: PT, OT, and RN, and surgery: Right common femoral to tibioperoneal trunk bypass with 6 mm PTFE on 11/04/2022    Disposition: Discharge disposition: 03-Skilled Nursing Facility       - For [facility] Registry use ---  Post-op:  Wound infection: No  Graft infection: No  Transfusion: No  If yes,  units given New Arrhythmia: No Patency judged by: [ x] Dopper only, [ ]  Palpable graft pulse, [ ]  Palpable distal pulse, [ ]  ABI inc. > 0.15, [ ]  Duplex D/C Ambulatory Status: Wheelchair  Complications: MI: [ x] No, [ ]  Troponin only, [ ]  EKG or Clinical CHF: No Resp failure: [ x] none, [ ]  Pneumonia, [ ]  Ventilator Chg in renal function: [ x] none, [ ]  Inc. Cr > 0.5, [ ]  Temp. Dialysis, [ ]  Permanent dialysis Stroke: [ x] None, [ ]  Minor, [ ]  Major Return to OR: No  Reason for return to OR: [ ]  Bleeding, [ ]  Infection, [ ]  Thrombosis, [ ]  Revision  Discharge medications: Statin use:  Yes ASA use:  Yes Plavix use:  No  for medical reason not medically necessary    Patient Instructions:  Allergies as of 11/12/2022       Reactions   Orange Fruit [citrus] Hives, Itching   Penicillins Hives, Itching   Pineapple Hives, Itching        Medication List     TAKE these medications    amLODipine 5 MG tablet Commonly known as: NORVASC Take 5 mg by mouth daily.   aspirin  81 MG chewable tablet Chew 81 mg by mouth daily.   atorvastatin 40 MG tablet Commonly known as: LIPITOR Take 40 mg by mouth daily. What changed: Another medication with the same name was removed. Continue taking this medication, and follow the directions you see here.   barrier cream Crea Commonly known as: non-specified Apply 1 Application topically 2 (two) times daily as needed (irritation).   cholecalciferol 25 MCG (1000 UNIT) tablet Commonly known as: VITAMIN D3 Take 1,000 Units by mouth daily.   doxycycline 100 MG EC tablet Commonly known as: DORYX Take 100 mg by mouth daily.   lisinopril 5 MG tablet Commonly known as: ZESTRIL Take 5 mg by mouth daily.   multivitamin with minerals tablet Take 1 tablet by mouth daily. Men 50+   neomycin-bacitracin-polymyxin Oint Commonly known as: NEOSPORIN Apply 1 Application topically every other day.               Discharge Care Instructions  (From admission, onward)           Start     Ordered   11/12/22 0000  Discharge wound care:       Comments: Wound vac will stay on for one week and will be removed in office on 8/15   11/12/22 1326           Activity: activity as tolerated Diet: low fat, low cholesterol diet Wound Care: Keep groin wound clean and dry.  Do not remove Prevena VAC from right lower leg incision.  This will be removed by [facility] in the office on 11/19/2022  Follow-up with [facility] on 11/19/2022 for [facility] removal and incision check  Signed: Loel Dubonnet, PA-C 11/12/2022 1:27 PM

## 2022-11-12 NOTE — TOC Transition Note (Signed)
Transition of Care [facility]) - CM/SW Discharge Note   Patient Details  Name: Steven Sharp MRN: [id number] Date of Birth: 1945/07/05  Transition of Care [facility]) CM/SW Contact:  Eduard Roux, LCSW Phone Number: 11/12/2022, 1:55 PM   Clinical Narrative:     Patient will Discharge to: [facility] Discharge Date: 11/12/2022 Family Notified: daughter Transport By: [facility]  Per MD patient is ready for discharge. RN, patient, and facility notified of discharge. Discharge Summary sent to facility. RN given number for [phone]. Ambulance transport requested for patient.   Clinical Social Worker signing off.  Antony Blackbird, MSW, LCSW Clinical Social Worker     Final next level of care: Skilled Nursing Facility Barriers to Discharge: Barriers Resolved   Patient Goals and CMS Choice CMS Medicare.gov Compare Post Acute Care list provided to:: Patient    Discharge Placement                Patient chooses bed at: [facility] Patient to be transferred to facility by: [facility] Name of family member notified: daughter Patient and family notified of of transfer: 11/12/22  Discharge Plan and Services Additional resources added to the After Visit Summary for                                       Social Determinants of Health (SDOH) Interventions SDOH Screenings   Food Insecurity: No Food Insecurity (11/09/2022)  Housing: Low Risk  (11/09/2022)  Transportation Needs: No Transportation Needs (11/09/2022)  Utilities: Not At Risk (11/09/2022)  Tobacco Use: High Risk (11/04/2022)     Readmission Risk Interventions     No data to display

## 2022-11-12 NOTE — Progress Notes (Signed)
RN attempted to give report, receiving RN unavailable at the moment.

## 2022-11-12 NOTE — Progress Notes (Addendum)
  Progress Note    11/12/2022 8:12 AM 8 Days Post-Op  Subjective:  no complaints    Vitals:   11/12/22 0353 11/12/22 0744  BP: 139/61 (!) 147/71  Pulse: (!) 103 98  Resp: 18 17  Temp: 98 F (36.7 C) 98 F (36.7 C)  SpO2: 98% 98%    Physical Exam: General:  resting comfortably Cardiac:  regular Lungs:  nonlabored Incisions:  right groin incision intact and dry. Right below knee incision with small dehiscence with serosanguineous drainage. Steri strips taken off this area and incision was covered with a prevena vac Extremities:  brisk right AT doppler signal    CBC    Component Value Date/Time   WBC 9.5 11/05/2022 0237   RBC 4.09 (L) 11/05/2022 0237   HGB 11.5 (L) 11/05/2022 0237   HCT 34.4 (L) 11/05/2022 0237   PLT 170 11/05/2022 0237   MCV 84.1 11/05/2022 0237   MCH 28.1 11/05/2022 0237   MCHC 33.4 11/05/2022 0237   RDW 14.0 11/05/2022 0237    BMET    Component Value Date/Time   NA 132 (L) 11/05/2022 0237   K 4.3 11/05/2022 0237   CL 99 11/05/2022 0237   CO2 20 (L) 11/05/2022 0237   GLUCOSE 112 (H) 11/05/2022 0237   BUN 12 11/05/2022 0237   CREATININE 1.05 11/05/2022 0237   CALCIUM 8.4 (L) 11/05/2022 0237   GFRNONAA >60 11/05/2022 0237    INR    Component Value Date/Time   INR 1.0 11/04/2022 0600     Intake/Output Summary (Last 24 hours) at 11/12/2022 6213 Last data filed at 11/12/2022 0500 Gross per 24 hour  Intake 250 ml  Output 500 ml  Net -250 ml      Assessment/Plan:  77 y.o. male is 8 days post op, s/p:  right fem-TPT bypass   -Doing well and denies any pain. Has not required pain meds post-operatively -Right groin incision intact and dry with steri strips. Right below knee incision with small area of dehiscence and serosanguineous drainage. No pus like drainage, erythema, or visible bypass. I have taken the steri strips off this area and placed a prevena vac on the incision. This will stay on for 1 week -RLE warm and well perfused  with brisk AT doppler signal -Hopefully discharging to [facility] today if family is agreeable. Patient is ready for discharge -Will arrange follow up with our office on 8/15 for prevena vac removal and incision check   Loel Dubonnet, PA-C [facility] [phone] 11/12/2022 8:12 AM

## 2022-11-14 ENCOUNTER — Other Ambulatory Visit: Payer: Self-pay

## 2022-11-14 ENCOUNTER — Emergency Department (HOSPITAL_COMMUNITY)
Admission: EM | Admit: 2022-11-14 | Discharge: 2022-11-15 | Disposition: A | Discharge status: Home / Self Care | Payer: Medicare (Managed Care) | Attending: Emergency Medicine | Admitting: Emergency Medicine

## 2022-11-14 ENCOUNTER — Encounter (HOSPITAL_COMMUNITY): Payer: Self-pay | Admitting: *Deleted

## 2022-11-14 DIAGNOSIS — Z5189 Encounter for other specified aftercare: Secondary | ICD-10-CM

## 2022-11-14 DIAGNOSIS — Z4801 Encounter for change or removal of surgical wound dressing: Secondary | ICD-10-CM | POA: Insufficient documentation

## 2022-11-14 DIAGNOSIS — Z7982 Long term (current) use of aspirin: Secondary | ICD-10-CM | POA: Insufficient documentation

## 2022-11-14 NOTE — ED Provider Triage Note (Signed)
Emergency Medicine Provider Triage Evaluation Note  Steven Sharp , a 77 y.o. male  was evaluated in triage.  Pt complains of wound vac not working. Pt reports the vac was delivered today and the battery was dead. He spent the day charging the battery. On arrival, wound vac was turned on and seems to be working.   Review of Systems  Positive:  Negative:   Physical Exam  BP (!) 142/65   Pulse 91   Temp 98.4 F (36.9 C) (Oral)   Resp 18   Ht 5\' 6"  (1.676 m)   Wt 68 kg   SpO2 100%   BMI 24.21 kg/m  Gen:   Awake, no distress   Resp:  Normal effort  MSK:   Left AKA Other:    Medical Decision Making  Medically screening exam initiated at 10:37 PM.  Appropriate orders placed.  Steven Sharp was informed that the remainder of the evaluation will be completed by another provider, this initial triage assessment does not replace that evaluation, and the importance of remaining in the ED until their evaluation is complete.     Jeannie Fend, PA-C 11/14/22 2239

## 2022-11-14 NOTE — ED Triage Notes (Signed)
Pt here via [facility] from [place] because his wound vac is not functioning properly.  States R knee surgery to remove fluid from his R knee a week ago (+/-).  He states the wound vac has not worked since it was placed 1 week ago.  Staff states wound vac stopped draining today.

## 2022-11-15 NOTE — ED Provider Notes (Signed)
[place] EMERGENCY DEPARTMENT AT [facility] Provider Note   CSN: [id number] Arrival date & time: 11/14/22  2145     History  Chief Complaint  Patient presents with   Wound Vac not draining    Steven Sharp is a 77 y.o. male.  77 year old male with multiple medical problems presents the ER today secondary to malfunctioning wound VAC.  He is at [facility] right now.  He had a femoral-popliteal bypass surgery with Dr. Lenell Antu recently.  Apparently was at [facility] and the [facility] was not working.  According to the records a 6 appointment is in 5 days with them.  Physician assistant saw the patient in triage and the [facility] was working after being turned on.        Home Medications Prior to Admission medications   Medication Sig Start Date End Date Taking? Authorizing Provider  amLODipine (NORVASC) 5 MG tablet Take 5 mg by mouth daily.    [provider]  aspirin 81 MG chewable tablet Chew 81 mg by mouth daily. 08/08/10   [provider]  atorvastatin (LIPITOR) 40 MG tablet Take 40 mg by mouth daily.    [provider]  barrier cream (NON-SPECIFIED) CREA Apply 1 Application topically 2 (two) times daily as needed (irritation).    [provider]  cholecalciferol (VITAMIN D3) 25 MCG (1000 UNIT) tablet Take 1,000 Units by mouth daily.    [provider]  doxycycline (DORYX) 100 MG EC tablet Take 100 mg by mouth daily.    [provider]  lisinopril (ZESTRIL) 5 MG tablet Take 5 mg by mouth daily.    [provider]  Multiple Vitamins-Minerals (MULTIVITAMIN WITH MINERALS) tablet Take 1 tablet by mouth daily. Men 50+    [provider]  neomycin-bacitracin-polymyxin (NEOSPORIN) OINT Apply 1 Application topically every other day.    [provider]      Allergies    Orange fruit [citrus], Penicillins, and Pineapple    Review of Systems   Review of Systems  Physical Exam Updated Vital Signs BP  (!) 150/61   Pulse 98   Temp 98.4 F (36.9 C) (Oral)   Resp 16   Ht 5\' 6"  (1.676 m)   Wt 68 kg   SpO2 95%   BMI 24.21 kg/m  Physical Exam Vitals and nursing note reviewed.  Constitutional:      Appearance: He is well-developed.  HENT:     Head: Normocephalic and atraumatic.     Mouth/Throat:     Mouth: Mucous membranes are moist.  Eyes:     Pupils: Pupils are equal, round, and reactive to light.  Cardiovascular:     Rate and Rhythm: Normal rate.  Pulmonary:     Effort: Pulmonary effort is normal. No respiratory distress.  Abdominal:     General: There is no distension.  Musculoskeletal:        General: Normal range of motion.     Cervical back: Normal range of motion.  Skin:    Comments: Wound vac in place to right leg. Not removed. Has some debris in the chamber, appears to be functional. No ttp, drainage or obvious issues around the wound.   Neurological:     Mental Status: He is alert.     ED Results / Procedures / Treatments   Labs (all labs ordered are listed, but only abnormal results are displayed) Labs Reviewed - No data to display  EKG None  Radiology No results found.  Procedures  Procedures    Medications Ordered in ED Medications - No data to display  ED Course/ Medical Decision Making/ A&P                                 Medical Decision Making Wound vac working. Will consult TOC to have SW call for wound care. VSS, no indication for further workup at this time. Sees Vascular on the 15th.   Final Clinical Impression(s) / ED Diagnoses Final diagnoses:  Visit for wound care    Rx / DC Orders ED Discharge Orders          Ordered    Home Health        11/15/22 [phone]    Face-to-face encounter (required for Medicare/Medicaid patients)       Comments: I Marily Memos certify that this patient is under my care and that I, or a nurse practitioner or physician's assistant working with me, had a face-to-face encounter that meets the physician  face-to-face encounter requirements with this patient on 11/15/2022. The encounter with the patient was in whole, or in part for the following medical condition(s) which is the primary reason for home health care (List medical condition): wound with vac and patient doesn't seem to understand how to use the vac fully. Doesn't know if he has a wound care nurse or appointments. Would like wound care evaluation and teaching. Further orders per PCP   11/15/22 0052              Osmany Azer, Barbara Cower, MD 11/15/22 9604

## 2022-11-15 NOTE — Progress Notes (Signed)
CSW spoke with Lelon Mast at [place] regarding patient. Lelon Mast confirmed patient can return to the facility. RN to call report to [phone] and PTAR when ready.  Edwin Dada, MSW, LCSW Transitions of Care  Clinical Social Worker II [phone]

## 2022-11-15 NOTE — ED Notes (Signed)
Patient come to ER for evaluation of drain tube not draining.

## 2022-11-15 NOTE — ED Notes (Signed)
This RN replaced Prevena Plus 150 ML canister.

## 2022-11-17 ENCOUNTER — Telehealth: Payer: Self-pay

## 2022-11-17 NOTE — Telephone Encounter (Signed)
Pt's daughter, Trey Sailors, called with some concerns regarding the pt's wound vac. There have been ongoing issues with the vac, visit to the ED, and the vac is still not working.  Reviewed pt's chart, returned call for clarification, two identifiers used. Unclear if the vac is full or just not working. When pt went to ED, it is unclear if the vac was changed or not.   Spoke with [place], [place] who advised taking the vac off and applying a dry dressing to the area.  Called [facility] to give them orders, nursing staff unavailable.  Elijah Birk with [facility] called.  Returned call to [employer] and gave orders. She stated that their provider also stated the same. Will get info to nursing staff at [facility]. Pt has f/u appt on 8/15. Confirmed understanding.

## 2022-11-17 NOTE — Progress Notes (Unsigned)
  POST OPERATIVE OFFICE NOTE    CC:  F/u for surgery  HPI:  This is a 77 y.o. male who is s/p *** on *** by Dr. Marland Kitchen    Pt returns today for follow up.  Pt states ***   Allergies  Allergen Reactions   Orange Fruit [Citrus] Hives and Itching   Penicillins Hives and Itching   Pineapple Hives and Itching    Current Outpatient Medications  Medication Sig Dispense Refill   amLODipine (NORVASC) 5 MG tablet Take 5 mg by mouth daily.     aspirin 81 MG chewable tablet Chew 81 mg by mouth daily.     atorvastatin (LIPITOR) 40 MG tablet Take 40 mg by mouth daily.     barrier cream (NON-SPECIFIED) CREA Apply 1 Application topically 2 (two) times daily as needed (irritation).     cholecalciferol (VITAMIN D3) 25 MCG (1000 UNIT) tablet Take 1,000 Units by mouth daily.     doxycycline (DORYX) 100 MG EC tablet Take 100 mg by mouth daily.     lisinopril (ZESTRIL) 5 MG tablet Take 5 mg by mouth daily.     Multiple Vitamins-Minerals (MULTIVITAMIN WITH MINERALS) tablet Take 1 tablet by mouth daily. Men 50+     neomycin-bacitracin-polymyxin (NEOSPORIN) OINT Apply 1 Application topically every other day.     No current facility-administered medications for this visit.     ROS:  See HPI  Physical Exam:  ***  Incision:  *** Extremities:  *** Neuro: *** Abdomen:  ***    Assessment/Plan:  This is a 77 y.o. male who is s/p: ***  -***   Loel Dubonnet, PA-C [facility] [phone]   Clinic MD:  ***

## 2022-11-19 ENCOUNTER — Ambulatory Visit (INDEPENDENT_AMBULATORY_CARE_PROVIDER_SITE_OTHER): Payer: Medicare (Managed Care) | Admitting: Physician Assistant

## 2022-11-19 VITALS — BP 118/59 | HR 82 | Temp 98.4°F | Resp 18 | Ht 66.0 in | Wt 150.0 lb

## 2022-11-19 DIAGNOSIS — T8131XA Disruption of external operation (surgical) wound, not elsewhere classified, initial encounter: Secondary | ICD-10-CM

## 2022-11-19 DIAGNOSIS — I70235 Atherosclerosis of native arteries of right leg with ulceration of other part of foot: Secondary | ICD-10-CM

## 2022-12-01 ENCOUNTER — Ambulatory Visit (INDEPENDENT_AMBULATORY_CARE_PROVIDER_SITE_OTHER): Payer: Medicare (Managed Care) | Admitting: Physician Assistant

## 2022-12-01 ENCOUNTER — Encounter: Payer: Self-pay | Admitting: Physician Assistant

## 2022-12-01 VITALS — BP 127/66 | HR 81 | Temp 98.8°F | Resp 18 | Ht 66.0 in | Wt 150.0 lb

## 2022-12-01 DIAGNOSIS — T8131XA Disruption of external operation (surgical) wound, not elsewhere classified, initial encounter: Secondary | ICD-10-CM

## 2022-12-01 DIAGNOSIS — I70235 Atherosclerosis of native arteries of right leg with ulceration of other part of foot: Secondary | ICD-10-CM

## 2022-12-01 NOTE — Progress Notes (Signed)
  POST OPERATIVE OFFICE NOTE    CC:  F/u for surgery  HPI:  This is a 77 y.o. male who is s/p right common femoral to TP trunk bypass with PTFE by Dr. Lenell Antu on 11/04/2022.  This was performed due to critical limb ischemia with second toe ulceration.  He currently resides in a nursing facility.  He experienced dehiscence of his distalmost incision of the right leg postoperatively.  This has been treated conservatively with wound care involving wet-to-dry dressing changes.  He believes the incision is healing well.  He denies any rest pain or tissue loss of the right foot.  Surgical history also significant for left AKA.  Allergies  Allergen Reactions   Orange Fruit [Citrus] Hives and Itching   Penicillins Hives and Itching   Pineapple Hives and Itching    Current Outpatient Medications  Medication Sig Dispense Refill   amLODipine (NORVASC) 5 MG tablet Take 5 mg by mouth daily.     aspirin 81 MG chewable tablet Chew 81 mg by mouth daily.     atorvastatin (LIPITOR) 40 MG tablet Take 40 mg by mouth daily.     barrier cream (NON-SPECIFIED) CREA Apply 1 Application topically 2 (two) times daily as needed (irritation).     cholecalciferol (VITAMIN D3) 25 MCG (1000 UNIT) tablet Take 1,000 Units by mouth daily.     doxycycline (DORYX) 100 MG EC tablet Take 100 mg by mouth daily.     lisinopril (ZESTRIL) 5 MG tablet Take 5 mg by mouth daily.     Multiple Vitamins-Minerals (MULTIVITAMIN WITH MINERALS) tablet Take 1 tablet by mouth daily. Men 50+     neomycin-bacitracin-polymyxin (NEOSPORIN) OINT Apply 1 Application topically every other day.     No current facility-administered medications for this visit.     ROS:  See HPI  Physical Exam:  Vitals:   12/01/22 1405  BP: 127/66  Pulse: 81  Resp: 18  Temp: 98.8 F (37.1 C)  TempSrc: Temporal  SpO2: 96%  Weight: 150 lb (68 kg)  Height: 5\' 6"  (1.676 m)    Incision: Right groin well-healed; distalmost incision of the right leg pictured  below appears to be healing well; right shin wound healed Extremities: Palpable DP pulse; second toe wound dry, small, superficial      Assessment/Plan:  This is a 78 y.o. male who is s/p right femoral to TP trunk bypass with PTFE due to critical limb ischemia with second toe ulcer  -Right leg is well-perfused with palpable DP pulse.  [facility] facility has been caring for the wounds well using wet-to-dry changes and wrapping the leg from foot to below the knee with a compressive wrap.  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  He has healed his shin wound.  The dehisced surgical incision appears to be healing well.  The second toe ulceration is dry, small, and superficial.  Sonny Dandy will continue their current wound care.  We will check a right leg bypass duplex and ABI in the next 4 to 6 weeks.  He knows to call/return office sooner with any questions or concerns   Emilie Rutter, PA-C [facility] [phone]  Clinic MD:  Lenell Antu

## 2022-12-03 ENCOUNTER — Encounter (HOSPITAL_COMMUNITY): Payer: Medicare Other

## 2022-12-08 ENCOUNTER — Encounter: Payer: Medicare Other | Admitting: Vascular Surgery

## 2022-12-18 ENCOUNTER — Other Ambulatory Visit: Payer: Self-pay

## 2022-12-18 DIAGNOSIS — I70235 Atherosclerosis of native arteries of right leg with ulceration of other part of foot: Secondary | ICD-10-CM

## 2022-12-30 ENCOUNTER — Ambulatory Visit (HOSPITAL_COMMUNITY)
Admission: RE | Admit: 2022-12-30 | Discharge: 2022-12-30 | Disposition: A | Discharge status: Home / Self Care | Payer: Medicare (Managed Care) | Source: Ambulatory Visit | Attending: Vascular Surgery | Admitting: Vascular Surgery

## 2022-12-30 ENCOUNTER — Ambulatory Visit (INDEPENDENT_AMBULATORY_CARE_PROVIDER_SITE_OTHER)
Admission: RE | Admit: 2022-12-30 | Discharge: 2022-12-30 | Disposition: A | Discharge status: Home / Self Care | Payer: Medicare (Managed Care) | Source: Ambulatory Visit | Attending: Vascular Surgery | Admitting: Vascular Surgery

## 2022-12-30 DIAGNOSIS — I70235 Atherosclerosis of native arteries of right leg with ulceration of other part of foot: Secondary | ICD-10-CM | POA: Insufficient documentation

## 2022-12-30 LAB — VAS US ABI WITH/WO TBI: Right ABI: 0.84

## 2023-01-05 ENCOUNTER — Encounter: Payer: Self-pay | Admitting: Physician Assistant

## 2023-01-05 ENCOUNTER — Ambulatory Visit (INDEPENDENT_AMBULATORY_CARE_PROVIDER_SITE_OTHER): Payer: Medicare (Managed Care) | Admitting: Physician Assistant

## 2023-01-05 VITALS — BP 149/77 | HR 88 | Temp 97.6°F

## 2023-01-05 DIAGNOSIS — I70235 Atherosclerosis of native arteries of right leg with ulceration of other part of foot: Secondary | ICD-10-CM

## 2023-01-05 NOTE — Progress Notes (Signed)
POST OPERATIVE OFFICE NOTE    CC:  F/u for surgery  HPI:  Steven Sharp is a 77 y.o. male who is s/p right common femoral to tibioperoneal trunk bypass with 6mm PTFE in subfascial tunnel by Dr.Hawken on 11/04/2022.  This was done for critical limb ischemia with second toe tip ulceration.  Postoperatively he had some issues with his below-knee incision, but this is now well-healed.  He returns today for follow-up.  He states he has been doing well since surgery.  All of his incisions are fully healed.  He had a small wound over his right tibia shortly after surgery, but this is now healed.  He denies any wounds on his feet or rest pain.  His right second toe tip ulceration is now also healed.  He is back at home and his daughter helps take care of him.   Allergies  Allergen Reactions   Orange Fruit [Citrus] Hives and Itching   Penicillins Hives and Itching   Pineapple Hives and Itching    Current Outpatient Medications  Medication Sig Dispense Refill   amLODipine (NORVASC) 5 MG tablet Take 5 mg by mouth daily.     aspirin 81 MG chewable tablet Chew 81 mg by mouth daily.     atorvastatin (LIPITOR) 40 MG tablet Take 40 mg by mouth daily.     barrier cream (NON-SPECIFIED) CREA Apply 1 Application topically 2 (two) times daily as needed (irritation).     cholecalciferol (VITAMIN D3) 25 MCG (1000 UNIT) tablet Take 1,000 Units by mouth daily.     doxycycline (DORYX) 100 MG EC tablet Take 100 mg by mouth daily.     lisinopril (ZESTRIL) 5 MG tablet Take 5 mg by mouth daily.     Multiple Vitamins-Minerals (MULTIVITAMIN WITH MINERALS) tablet Take 1 tablet by mouth daily. Men 50+     neomycin-bacitracin-polymyxin (NEOSPORIN) OINT Apply 1 Application topically every other day.     No current facility-administered medications for this visit.     ROS:  See HPI  Physical Exam:  Incision: Right groin and below-knee incisions well-healed without erythema, drainage, or dehiscence Extremities: Brisk  right AT/DP Doppler signals.  Healed second toe tip ulceration Neuro: Intact motor and sensation of right lower extremity  Studies: RLE BPG Duplex (12/30/2022)  RIGHT      PSV cm/sRatioStenosisWaveform  Comments  +-----------+--------+-----+--------+----------+--------+  ATA Distal 55                   monophasic          +-----------+--------+-----+--------+----------+--------+  PTA Distal 20                   monophasic          +-----------+--------+-----+--------+----------+--------+  PERO Distal34                   monophasic          +-----------+--------+-----+--------+----------+--------+     Right Graft #1: CFA to TPT  +------------------+--------+--------+----------+--------+                   PSV cm/sStenosisWaveform  Comments  +------------------+--------+--------+----------+--------+  Inflow           180             monophasic          +------------------+--------+--------+----------+--------+  Prox Anastomosis  63              monophasic          +------------------+--------+--------+----------+--------+  Proximal Graft    95              monophasic          +------------------+--------+--------+----------+--------+  Mid Graft         73              monophasic          +------------------+--------+--------+----------+--------+  Distal Graft      66              monophasic          +------------------+--------+--------+----------+--------+  Distal Anastomosis63              monophasic          +------------------+--------+--------+----------+--------+  Outflow          61              monophasic          +------------------+--------+--------+----------+--------+   ABIs (12/30/2022) +---------+------------------+-----+----------+--------+  Right   Rt Pressure (mmHg)IndexWaveform  Comment   +---------+------------------+-----+----------+--------+  Brachial 171                                         +---------+------------------+-----+----------+--------+  PTA     125               0.73 monophasic          +---------+------------------+-----+----------+--------+  DP      144               0.84 monophasic          +---------+------------------+-----+----------+--------+  Great Toe93                0.54                     +---------+------------------+-----+----------+--------+   +--------+------------------+-----+--------+-------+  Left   Lt Pressure (mmHg)IndexWaveformComment  +--------+------------------+-----+--------+-------+  [id number]                                    +--------+------------------+-----+--------+-------+   +-------+-----------+-----------+------------+------------+  ABI/TBIToday's ABIToday's TBIPrevious ABIPrevious TBI  +-------+-----------+-----------+------------+------------+  Right 0.84       0.54                                 +-------+-----------+-----------+------------+------------+  Left  AKA                                             +-------+-----------+-----------+------------+------------+    Assessment/Plan:  This is a 77 y.o. male who is here for bypass surveillance  -The patient underwent right common femoral to tibioperoneal trunk bypass on 11/04/2022 for CLI with right second toe ulceration.  His second toe ulceration has now completely healed -Right lower extremity bypass graft duplex demonstrates a patent bypass without stenosis.  His ABIs on the right are 0.84.  He has a great toe pressure of 93 -He denies any new ulcerations.  He has no rest pain in the right foot.  On exam he has a brisk right DP and AT Doppler signal -He mobilizes mostly via wheelchair.  He can stand on his right leg to  transfer with the assistance of his daughter -He has chronic issues with lower extremity swelling.  I have encouraged him to elevate his leg throughout the day to prevent venous  wounds -He can follow-up with our office in 9 months with repeat right lower extremity bypass graft duplex and ABIs   Loel Dubonnet, PA-C [facility] [phone]   Clinic MD: Steve Rattler

## 2023-01-19 ENCOUNTER — Other Ambulatory Visit: Payer: Self-pay

## 2023-01-19 DIAGNOSIS — I70235 Atherosclerosis of native arteries of right leg with ulceration of other part of foot: Secondary | ICD-10-CM

## 2023-03-10 ENCOUNTER — Institutional Professional Consult (permissible substitution): Payer: Medicare (Managed Care) | Admitting: Internal Medicine

## 2023-03-11 ENCOUNTER — Encounter: Payer: Self-pay | Admitting: Internal Medicine

## 2023-03-11 ENCOUNTER — Ambulatory Visit (INDEPENDENT_AMBULATORY_CARE_PROVIDER_SITE_OTHER): Payer: Medicare (Managed Care) | Admitting: Internal Medicine

## 2023-03-11 VITALS — BP 142/82 | HR 86 | Ht 66.0 in | Wt 150.0 lb

## 2023-03-11 DIAGNOSIS — R053 Chronic cough: Secondary | ICD-10-CM | POA: Diagnosis not present

## 2023-03-11 DIAGNOSIS — F17210 Nicotine dependence, cigarettes, uncomplicated: Secondary | ICD-10-CM | POA: Diagnosis not present

## 2023-03-11 DIAGNOSIS — Z122 Encounter for screening for malignant neoplasm of respiratory organs: Secondary | ICD-10-CM

## 2023-03-11 LAB — POCT EXHALED NITRIC OXIDE: FeNO level (ppb): 6

## 2023-03-11 MED ORDER — ESOMEPRAZOLE MAGNESIUM 40 MG PO CPDR: 40.0000 mg | DELAYED_RELEASE_CAPSULE | Freq: Every day | ORAL | 5 refills | Status: DC

## 2023-03-11 NOTE — Patient Instructions (Addendum)
It was a pleasure to see you today!  Please schedule follow up scheduled with myself in 2 months.  If my schedule is not open yet, we will contact you with a reminder closer to that time. Please call [phone] if you haven't heard from [place] a month before, and always call us sooner if issues or concerns arise. You can also send [place] a message through MyChart, but but aware that this is not to be used for urgent issues and it may take up to 5-7 days to receive a reply. Please be aware that you will likely be able to view your results before I have a chance to respond to them. Please give [place] 5 business days to respond to any non-urgent results.   Because of your smoking history you meet criteria for lung cancer screening  I am referring you to our lung cancer screening program - our nurses will call you to schedule this appointment and get a CT scan.   I think your cough could be related to reflux. Start taking a once daily reflux medication to see if this helps the cough. If this doesn't work we can try something else.   I think the most important thing you need to do for your health is quit smoking.  You don't need to take any inhalers if they aren't helping you.    What is GERD? Gastroesophageal reflux disease (GERD) is gastroesophageal reflux diseasewhich occurs when the lower esophageal sphincter (LES) opens spontaneously, for varying periods of time, or does not close properly and stomach contents rise up into the esophagus. GER is also called acid reflux or acid regurgitation, because digestive juices--called acids--rise up with the food. The esophagus is the tube that carries food from the mouth to the stomach. The LES is a ring of muscle at the bottom of the esophagus that acts like a valve between the esophagus and stomach.  When acid reflux occurs, food or fluid can be tasted in the back of the mouth. When refluxed stomach acid touches the lining of the esophagus it may cause a burning  sensation in the chest or throat called heartburn or acid indigestion. Occasional reflux is common. Persistent reflux that occurs more than twice a week is considered GERD, and it can eventually lead to more serious health problems. People of all ages can have GERD. Studies have shown that GERD may worsen or contribute to asthma, chronic cough, and pulmonary fibrosis.   What are the symptoms of GERD? The main symptom of GERD in adults is frequent heartburn, also called acid indigestion--burning-type pain in the lower part of the mid-chest, behind the breast bone, and in the mid-abdomen.  Not all reflux is acidic in nature, and many patients don't have heart burn at all. Sometimes it feels like a cough (either dry or with mucus), choking sensation, asthma, shortness of breath, waking up at night, frequent throat clearing, or trouble swallowing.    What causes GERD? The reason some people develop GERD is still unclear. However, research shows that in people with GERD, the LES relaxes while the rest of the esophagus is working. Anatomical abnormalities such as a hiatal hernia may also contribute to GERD. A hiatal hernia occurs when the upper part of the stomach and the LES move above the diaphragm, the muscle wall that separates the stomach from the chest. Normally, the diaphragm helps the LES keep acid from rising up into the esophagus. When a hiatal hernia is present, acid reflux  can occur more easily. A hiatal hernia can occur in people of any age and is most often a normal finding in otherwise healthy people over age 69. Most of the time, a hiatal hernia produces no symptoms.   Other factors that may contribute to GERD include - Obesity or recent weight gain - Pregnancy  - Smoking  - Diet - Certain medications  Common foods that can worsen reflux symptoms include: - carbonated beverages - artificial sweeteners - citrus fruits  - chocolate  - drinks with caffeine or alcohol  - fatty and fried  foods  - garlic and onions  - mint flavorings  - spicy foods  - tomato-based foods, like spaghetti sauce, salsa, chili, and pizza   Lifestyle Changes If you smoke, stop.  Avoid foods and beverages that worsen symptoms (see above.) Lose weight if needed.  Eat small, frequent meals.  Wear loose-fitting clothes.  Avoid lying down for 3 hours after a meal.  Raise the head of your bed 6 to 8 inches by securing wood blocks under the bedposts. Just using extra pillows will not help, but using a wedge-shaped pillow may be helpful.  Medications  H2 blockers, such as cimetidine (Tagamet HB), famotidine (Pepcid AC), nizatidine (Axid AR), and ranitidine (Zantac 75), decrease acid production. They are available in prescription strength and over-the-counter strength. These drugs provide short-term relief and are effective for about half of those who have GERD symptoms.  Proton pump inhibitors include omeprazole (Prilosec, Zegerid), lansoprazole (Prevacid), pantoprazole (Protonix), rabeprazole (Aciphex), and esomeprazole (Nexium), which are available by prescription. Prilosec is also available in over-the-counter strength. Proton pump inhibitors are more effective than H2 blockers and can relieve symptoms and heal the esophageal lining in almost everyone who has GERD.  Because drugs work in different ways, combinations of medications may help control symptoms. People who get heartburn after eating may take both antacids and H2 blockers. The antacids work first to neutralize the acid in the stomach, and then the H2 blockers act on acid production. By the time the antacid stops working, the H2 blocker will have stopped acid production. Your health care provider is the best source of information about how to use medications for GERD.   Points to Remember 1. You can have GERD without having heartburn. Your symptoms could include a dry cough, asthma symptoms, or trouble swallowing.  2. Taking medications  daily as prescribed is important in controlling you symptoms.  Sometimes it can take up to 8 weeks to fully achieve the effects of the medications prescribed.  3. Coughing related to GERD can be difficult to treat and is very frustrating!  However, it is important to stick with these medications and lifestyle modifications before pursuing more aggressive or invasive test and treatments.

## 2023-03-11 NOTE — Progress Notes (Deleted)
Steven Sharp    [id number]    [birth year]  Primary Care Physician:[facility]  Referring Physician: [facility], [facility] [address] 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  Exposures: lives independently, daughter is nearby to help out, has home aids coming in.  Smoking history: see below ongoing 1/2 ppd  Social History   Occupational History   Not on file  Tobacco Use   Smoking status: Every Day    Current packs/day: 0.50    Average packs/day: 1 pack/day for 68.9 years (68.0 ttl pk-yrs)    Types: Cigarettes    Start date: 98   Smokeless tobacco: Not on file  Vaping Use   Vaping status: Never Used  Substance and Sexual Activity   Alcohol use: Not Currently    Comment: a beer-very rare   Drug use: Not Currently   Sexual activity: Not on file    Relevant family history: *** Family History  Problem Relation Age of Onset   Lung cancer Neg Hx    Lung disease Neg Hx     Past Medical History:  Diagnosis Date   Arthritis    Cancer (HCC)    prostrate   History of kidney stones    Passed   Hypertension    Peripheral artery disease (HCC)    Stool incontinence     Past Surgical History:  Procedure Laterality Date   ABDOMINAL AORTOGRAM W/LOWER EXTREMITY N/A 10/23/2022   Procedure: ABDOMINAL AORTOGRAM W/LOWER EXTREMITY;  Surgeon: Leonie Douglas, MD;  Location: [facility] INVASIVE CV [facility];  Service: Cardiovascular;  Laterality: N/A;   ABOVE KNEE LEG AMPUTATION Left 2023   FEMORAL-POPLITEAL BYPASS GRAFT Right 11/04/2022   Procedure: RIGHT COMMON FEMORAL-TIBIAL PERITONEAL TRUNK ARTERY BYPASS USING 6mm PROPATEN REMOVABLE RING  GORETEX GRAFT;  Surgeon: Leonie Douglas, MD;  Location: [facility] OR;  Service: Vascular;  Laterality: Right;     Physical Exam: Blood pressure (!) 142/82, pulse 86, height 5\' 6"  (1.676 m), weight 150 lb (68 kg), SpO2 98%. Gen:      No acute distress ENT:  ***no nasal polyps, mucus membranes moist Lungs:    No increased respiratory effort, symmetric chest wall excursion, clear to auscultation bilaterally, ***no wheezes or crackles CV:         Regular rate and rhythm; no murmurs, rubs, or gallops.  No pedal edema Abd:      + bowel sounds; soft, non-tender; no distension MSK: no acute synovitis of DIP or PIP joints, no mechanics hands.  Skin:      Warm and dry; no rashes*** Neuro: normal speech, no focal facial asymmetry Psych: alert and oriented x3, normal mood and affect   Data Reviewed/Medical Decision Making:  Independent interpretation of tests: Imaging:  Review of patient's *** images revealed ***. The patient's images have been independently reviewed by me.    PFTs: I have personally reviewed the patient's PFTs and ***     No data to display          Labs: ***  Immunization status:   There is no immunization history on file for this patient.  I reviewed prior external note(s) from ***  I reviewed the result(s) of the labs and imaging as noted above.   I have ordered ***  Discussion of management or test interpretation with another colleague ***.   Assessment:  Chronic cough Tobacco use disorder  Plan/Recommendations:   We discussed disease management and progression at length today.   Smoking Cessation Counseling:  1. The patient is an everyday smoker and symptomatic due to the following condition presumed copd 2. The patient is currently contemplative in quitting smoking. 3. I advised patient to quit smoking. 4. We identified patient specific barriers to change.  5. I personally spent 3 minutes counseling the patient regarding tobacco use disorder. 6. We  discussed management of stress and anxiety to help with smoking cessation, when applicable. 7. We discussed nicotine replacement therapy, Wellbutrin, Chantix as possible options. 8. I advised setting a quit date. 9. Follow?up arranged with our office to continue ongoing discussions. 10.Resources given to patient including quit hotline.    Return to Care: No follow-ups on file.  Durel Salts, MD Pulmonary [facility] Care [facility] Office:[phone]  CC: [facility]

## 2023-03-11 NOTE — Progress Notes (Signed)
Steven Sharp    [id number]    [birth year]  Primary Care Physician:Inc, [facility]  Referring Physician: Inc, [facility] [address] [place],  [place] [address] Reason for Consultation: coughing Date of Consultation: 03/11/2023  Chief complaint:   Chief Complaint  Patient presents with   Consult     PFT , coughing /clear mucus      HPI:  Discussed the use of AI scribe software for clinical note transcription with the patient, who gave verbal consent to proceed.  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 The cough is described as productive, with white sputum, and is particularly bothersome at night. However, the patient denies any associated dyspnea or hemoptysis.  The patient has a significant smoking history, having started at the age of 20 and currently smoking half a pack a day, a reduction from previous consumption following the leg amputation. Despite the cough, the patient denies any history of pneumonia, bronchitis, or other respiratory issues.  The patient also reports a history of various occupations, including truck driving, foundry work, Production designer, theatre/television/film, and bread truck Airline pilot. He currently lives alone but has daily visits from a home health aide.  The patient has been primarily using a motorized chair for mobility since the amputation, expressing a desire for crutches which have not been provided due to perceived upper body strength limitations. The patient has been sleeping in a reclining chair, which is now broken, and will be transitioning to sleeping in a bed.  The patient denies any reflux or heartburn symptoms, sinus congestion, postnasal drainage, or seasonal allergies. He has been sent an inhaler by his  healthcare provider but does not use it, as he does not experience any chest tightness, wheezing, or shortness of breath. The patient has attempted to quit smoking three times in the past, with one attempt lasting two years, but has since resumed smoking.      Social history: The patient also reports a history of various occupations, including truck driving, foundry work, Production designer, theatre/television/film, and bread truck Airline pilot. He currently lives alone but has daily visits from a home health aide.  Smoking history: 68 pack years  Social History   Occupational History   Not on file  Tobacco Use   Smoking status: Every Day    Current packs/day: 0.50    Average packs/day: 1 pack/day for 68.9 years (68.0 ttl pk-yrs)    Types: Cigarettes    Start date: 56   Smokeless tobacco: Not on file  Vaping Use   Vaping status: Never Used  Substance and Sexual Activity   Alcohol use: Not Currently    Comment: a beer-very rare   Drug use: Not Currently   Sexual activity: Not on file    Relevant family history:  Family History  Problem Relation Age of Onset   Lung cancer Neg Hx    Lung disease Neg Hx     Past Medical History:  Diagnosis Date   Arthritis    Cancer (HCC)    prostrate   History of kidney stones    Passed   Hypertension    Peripheral artery disease (HCC)    Stool incontinence     Past Surgical History:  Procedure Laterality  Date   ABDOMINAL AORTOGRAM W/LOWER EXTREMITY N/A 10/23/2022   Procedure: ABDOMINAL AORTOGRAM W/LOWER EXTREMITY;  Surgeon: Leonie Douglas, MD;  Location: [facility] INVASIVE CV LAB;  Service: Cardiovascular;  Laterality: N/A;   ABOVE KNEE LEG AMPUTATION Left 2023   FEMORAL-POPLITEAL BYPASS GRAFT Right 11/04/2022   Procedure: RIGHT COMMON FEMORAL-TIBIAL PERITONEAL TRUNK ARTERY BYPASS USING 6mm PROPATEN REMOVABLE RING GORETEX GRAFT;  Surgeon: Leonie Douglas, MD;  Location: [facility] OR;  Service: Vascular;  Laterality: Right;     Physical Exam: Blood pressure (!)  142/82, pulse 86, height 5\' 6"  (1.676 m), weight 150 lb (68 kg), SpO2 98%. Gen:      No acute distress, chronically ill in motorized wheelchair ENT:  mild nasal debris no nasal polyps, mucus membranes moist Lungs:  diminished, non labored, no wheeze CV:         Regular rate and rhythm; no murmurs, rubs, or gallops.  No pedal edema Abd:      + bowel sounds; soft, non-tender; no distension MSK: no acute synovitis of DIP or PIP joints, no mechanics hands.  Skin:      Warm and dry; no rashes Neuro: normal speech, no focal facial asymmetry Psych: alert and oriented x3, normal mood and affect Ext: Left AKA  Data Reviewed/Medical Decision Making:  Independent interpretation of tests: Imaging:   PFTs:  Labs:  Lab Results  Component Value Date   NA 132 (L) 11/05/2022   K 4.3 11/05/2022   CO2 20 (L) 11/05/2022   GLUCOSE 112 (H) 11/05/2022   BUN 12 11/05/2022   CREATININE 1.05 11/05/2022   CALCIUM 8.4 (L) 11/05/2022   GFRNONAA >60 11/05/2022   Lab Results  Component Value Date   WBC 9.5 11/05/2022   HGB 11.5 (L) 11/05/2022   HCT 34.4 (L) 11/05/2022   MCV 84.1 11/05/2022   PLT 170 11/05/2022    Immunization status:   There is no immunization history on file for this patient.   I reviewed prior external note(s) from hospital stays  I reviewed the result(s) of the labs and imaging as noted above.   I have ordered feno  Assessment and Plan:    Chronic Cough Cough for 3-4 months, productive of white sputum, worse at night. No dyspnea. Possible contributing factors include smoking, postnasal drip, and gastroesophageal reflux disease (GERD). -Trial of acid reflux medication for 8 weeks. -Return for follow-up in a few weeks to assess response to treatment.  Tobacco Use Longstanding history of smoking, currently half a pack per day. Reduced from a pack every 2.5 days after leg amputation. -Encourage continued reduction and eventual cessation of smoking. -Confirmed receipt of  annual influenza vaccination. - meets criteria for lung cancer screening - will refer to our clinic   Probable COPD - denies symptoms - consider PFTs in the future      Feno obtained today 8 ppb  Smoking Cessation Counseling:  1. The patient is an everyday smoker and symptomatic due to the following condition copd, cough 2. The patient is currently pre-contemplative in quitting smoking. 3. I advised patient to quit smoking. 4. We identified patient specific barriers to change.  5. I personally spent 3  minutes counseling the patient regarding tobacco use disorder. 6. We discussed management of stress and anxiety to help with smoking cessation, when applicable. 7. We discussed nicotine replacement therapy, Wellbutrin, Chantix as possible options. 8. I advised setting a quit date. 9. Follow?up arranged with our office to continue ongoing discussions. 10.Resources given to patient  including quit hotline.    Return to Care: Return in about 2 months (around 05/12/2023).  Durel Salts, MD Pulmonary and Critical Care Medicine [facility] Office:[phone]  CC: [facility], [facility]

## 2023-03-14 ENCOUNTER — Emergency Department (HOSPITAL_COMMUNITY)
Admission: EM | Admit: 2023-03-14 | Discharge: 2023-03-15 | Disposition: A | Discharge status: Home / Self Care | Payer: Medicare (Managed Care) | Attending: Emergency Medicine | Admitting: Emergency Medicine

## 2023-03-14 ENCOUNTER — Emergency Department (HOSPITAL_COMMUNITY): Payer: Medicare (Managed Care)

## 2023-03-14 ENCOUNTER — Encounter (HOSPITAL_COMMUNITY): Payer: Self-pay | Admitting: Emergency Medicine

## 2023-03-14 DIAGNOSIS — I10 Essential (primary) hypertension: Secondary | ICD-10-CM | POA: Insufficient documentation

## 2023-03-14 DIAGNOSIS — Z7982 Long term (current) use of aspirin: Secondary | ICD-10-CM | POA: Diagnosis not present

## 2023-03-14 DIAGNOSIS — R072 Precordial pain: Secondary | ICD-10-CM

## 2023-03-14 DIAGNOSIS — Z79899 Other long term (current) drug therapy: Secondary | ICD-10-CM | POA: Insufficient documentation

## 2023-03-14 DIAGNOSIS — R600 Localized edema: Secondary | ICD-10-CM | POA: Insufficient documentation

## 2023-03-14 DIAGNOSIS — R0789 Other chest pain: Secondary | ICD-10-CM | POA: Insufficient documentation

## 2023-03-14 LAB — BRAIN NATRIURETIC PEPTIDE: B Natriuretic Peptide: 57.9 pg/mL (ref 0.0–100.0)

## 2023-03-14 LAB — COMPREHENSIVE METABOLIC PANEL
ALT: 18 U/L (ref 0–44)
AST: 23 U/L (ref 15–41)
Albumin: 3.5 g/dL (ref 3.5–5.0)
Alkaline Phosphatase: 100 U/L (ref 38–126)
Anion gap: 8 (ref 5–15)
BUN: 14 mg/dL (ref 8–23)
CO2: 23 mmol/L (ref 22–32)
Calcium: 8.8 mg/dL — ABNORMAL LOW (ref 8.9–10.3)
Chloride: 102 mmol/L (ref 98–111)
Creatinine, Ser: 0.85 mg/dL (ref 0.61–1.24)
GFR, Estimated: >60 mL/min (ref >60)
Glucose, Bld: 92 mg/dL (ref 70–99)
Potassium: 3.8 mmol/L (ref 3.5–5.1)
Sodium: 133 mmol/L — ABNORMAL LOW (ref 135–145)
Total Bilirubin: 0.4 mg/dL (ref <1.2)
Total Protein: 7.5 g/dL (ref 6.5–8.1)

## 2023-03-14 LAB — CBC
HCT: 36.8 % — ABNORMAL LOW (ref 39.0–52.0)
Hemoglobin: 12.4 g/dL — ABNORMAL LOW (ref 13.0–17.0)
MCH: 28.9 pg (ref 26.0–34.0)
MCHC: 33.7 g/dL (ref 30.0–36.0)
MCV: 85.8 fL (ref 80.0–100.0)
Platelets: 198 10*3/uL (ref 150–400)
RBC: 4.29 MIL/uL (ref 4.22–5.81)
RDW: 14.3 % (ref 11.5–15.5)
WBC: 7.1 10*3/uL (ref 4.0–10.5)
nRBC: 0 % (ref 0.0–0.2)

## 2023-03-14 LAB — TROPONIN I (HIGH SENSITIVITY)
Troponin I (High Sensitivity): 12 ng/L (ref <18)
Troponin I (High Sensitivity): 12 ng/L (ref <18)

## 2023-03-14 LAB — CBG MONITORING, ED: Glucose-Capillary: 109 mg/dL — ABNORMAL HIGH (ref 70–99)

## 2023-03-14 LAB — D-DIMER, QUANTITATIVE: D-Dimer, Quant: 1.57 ug{FEU}/mL — ABNORMAL HIGH (ref 0.00–0.50)

## 2023-03-14 MED ORDER — IOHEXOL 350 MG/ML SOLN
75.0000 mL | Freq: Once | INTRAVENOUS | Status: AC | PRN
  Administered 2023-03-14: 75 mL via INTRAVENOUS

## 2023-03-14 MED ORDER — ASPIRIN 81 MG PO CHEW
324.0000 mg | CHEWABLE_TABLET | Freq: Once | ORAL | Status: AC
  Administered 2023-03-14: 324 mg via ORAL
  Filled 2023-03-14: qty 4

## 2023-03-14 NOTE — ED Notes (Signed)
 [facility]

## 2023-03-14 NOTE — ED Provider Notes (Signed)
[place] EMERGENCY DEPARTMENT AT [facility] Provider Note   CSN: [id number] Arrival date & time: 03/14/23  1835     History  Chief Complaint  Patient presents with   Chest Pain    Steven Sharp is a 77 y.o. male.   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  Patient also had a femoropopliteal bypass surgery.  Patient states he did recover somewhat and does have some movement in his upper extremities.  He uses a motorized wheelchair.  Patient presented to the ED for evaluation of chest heaviness.  Patient states he noticed it last evening.  He experiences a heaviness in his chest and a numb sensation in his upper extremities.  Patient does not have any known history of heart disease.  He has had a cough ongoing for the last month or so.  He has not had any purulent sputum.  No history of PE or DVT  Home Medications Prior to Admission medications   Medication Sig Start Date End Date Taking? Authorizing Provider  cholecalciferol (VITAMIN D3) 25 MCG (1000 UNIT) tablet Take 1,000 Units by mouth daily.   Yes [provider]  Multiple Vitamins-Minerals (MULTIVITAMIN WITH MINERALS) tablet Take 1 tablet by mouth daily. Men 50+   Yes [provider]  amLODipine (NORVASC) 5 MG tablet Take 5 mg by mouth daily.    [provider]  aspirin 81 MG chewable tablet Chew 81 mg by mouth daily. 08/08/10   [provider]  atorvastatin (LIPITOR) 40 MG tablet Take 40 mg by mouth daily.    [provider]  barrier cream (NON-SPECIFIED) CREA Apply 1 Application topically 2 (two) times daily as needed (irritation).    [provider]  doxycycline (DORYX) 100 MG EC tablet Take 100 mg by mouth daily.    [provider]  esomeprazole (NEXIUM) 40 MG capsule Take 1 capsule (40 mg total) by mouth daily at 12 noon.  03/11/23   Charlott Holler, MD  lisinopril (ZESTRIL) 5 MG tablet Take 5 mg by mouth daily.    [provider]  neomycin-bacitracin-polymyxin (NEOSPORIN) OINT Apply 1 Application topically every other day.    [provider]      Allergies    Orange fruit [citrus], Penicillins, and Pineapple    Review of Systems   Review of Systems  Cardiovascular:  Positive for chest pain.    Physical Exam Updated Vital Signs BP (!) 163/74   Pulse 98   Temp 98.3 F (36.8 C) (Oral)   Resp 16   Ht 1.676 m (5\' 6" )   Wt 68 kg   SpO2 96%   BMI 24.21 kg/m  Physical Exam Vitals and nursing note reviewed.  Constitutional:      Appearance: He is well-developed. He is not diaphoretic.  HENT:     Head: Normocephalic and atraumatic.     Right Ear: External ear normal.     Left Ear: External ear normal.  Eyes:     General: No scleral icterus.       Right eye: No discharge.        Left eye: No discharge.     Conjunctiva/sclera: Conjunctivae normal.  Neck:     Trachea: No tracheal deviation.  Cardiovascular:     Rate and Rhythm: Normal rate and regular rhythm.  Pulmonary:     Effort: Pulmonary effort is  normal. No respiratory distress.     Breath sounds: Normal breath sounds. No stridor. No wheezing or rales.  Abdominal:     General: Bowel sounds are normal. There is no distension.     Palpations: Abdomen is soft.     Tenderness: There is no abdominal tenderness. There is no guarding or rebound.  Musculoskeletal:        General: No swelling, tenderness or deformity.     Cervical back: Neck supple.     Right lower leg: No tenderness. Edema present.  Skin:    General: Skin is warm and dry.     Findings: No rash.  Neurological:     General: No focal deficit present.     Mental Status: He is alert. Mental status is at baseline.     Cranial Nerves: No cranial nerve deficit, dysarthria or facial asymmetry.     Motor: Atrophy and abnormal muscle tone present.     Comments:  Quadriplegia, some movement in upper extremities, chronic spasticity contractures bilateral hands, left greater than right  Psychiatric:        Mood and Affect: Mood normal.     ED Results / Procedures / Treatments   Labs (all labs ordered are listed, but only abnormal results are displayed) Labs Reviewed  CBC - Abnormal; Notable for the following components:      Result Value   Hemoglobin 12.4 (*)    HCT 36.8 (*)    All other components within normal limits  COMPREHENSIVE METABOLIC PANEL - Abnormal; Notable for the following components:   Sodium 133 (*)    Calcium 8.8 (*)    All other components within normal limits  D-DIMER, QUANTITATIVE (NOT AT [facility]) - Abnormal; Notable for the following components:   D-Dimer, Quant 1.57 (*)    All other components within normal limits  CBG MONITORING, ED - Abnormal; Notable for the following components:   Glucose-Capillary 109 (*)    All other components within normal limits  BRAIN NATRIURETIC PEPTIDE  TROPONIN I (HIGH SENSITIVITY)  TROPONIN I (HIGH SENSITIVITY)    EKG EKG Interpretation Date/Time:  Sunday March 14 2023 20:32:22 EST Ventricular Rate:  101 PR Interval:  167 QRS Duration:  69 QT Interval:  329 QTC Calculation: 427 R Axis:   64  Text Interpretation: Sinus tachycardia Probable LVH with secondary repol abnrm Anterior Q waves, possibly due to LVH No significant change since last tracing Confirmed by Linwood Dibbles [phone]) on 03/14/2023 8:56:09 PM  Radiology DG Chest Portable 1 View  Result Date: 03/14/2023 CLINICAL DATA:  Cough, anterior chest wall pain EXAM: PORTABLE CHEST 1 VIEW COMPARISON:  None Available. FINDINGS: Mild left basilar opacity, atelectasis versus pneumonia, with a possible small left pleural effusion. No pneumothorax. The heart is normal in size.  Thoracic aortic atherosclerosis. IMPRESSION: Mild left basilar opacity, atelectasis versus pneumonia, with a possible small left pleural effusion. Electronically  Signed   By: Charline Bills M.D.   On: 03/14/2023 19:39    Procedures Procedures    Medications Ordered in ED Medications  aspirin chewable tablet 324 mg (324 mg Oral Given 03/14/23 1945)    ED Course/ Medical Decision Making/ A&P Clinical Course as of 03/14/23 2336  Sun Mar 14, 2023  2315 CBC normal.  Metabolic panel normal.  Troponin normal [JK]    Clinical Course User Index [JK] Linwood Dibbles, MD  Medical Decision Making Amount and/or Complexity of Data Reviewed Labs: ordered. Radiology: ordered.  Risk OTC drugs.  Patient presented to the ED for evaluation of chest pressure associated with arm tingling.  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  No new deficits noted on exam.  The patient's initial troponin was normal.  Troponin normal.  No signs of acute coronary ischemia.  X-ray does not show evidence of pneumonia.  D-dimer however was elevated.  Will plan on CT angiogram to rule out PE.        Final Clinical Impression(s) / ED Diagnoses Final diagnoses:  None    Rx / DC Orders ED Discharge Orders     None         Linwood Dibbles, MD 03/14/23 2337

## 2023-03-14 NOTE — ED Triage Notes (Signed)
Per EMS from home. Since last night, cramping in bilateral hands with a sense of heaviness and stiffness. AKA left leg. No LOC. Seen yesterday by pulmonologist for persistent cough and anterior chest wall pain. Positive Trousseau's sign.  BP 180/86 RR 20 97 on RA

## 2023-03-15 NOTE — ED Notes (Signed)
Pt daughter requested pt be transported home via [facility]. RN attempted to contact [facility] to arrange transport. [facility] informed [facility] that they were closed and unable to transport at this time. Pt informed of the situation, and pt agreed to use whatever transportation service the hospital was able to provide. RN contacted [facility] and arranged transportation.

## 2023-03-15 NOTE — ED Provider Notes (Signed)
Informed by patient's nurse daughter is on phone and irate that she has not been updated regularly on labs and imaging.    EDP obtained approval from patient to speak to daughter via phone.  Nurse present to witness consent based on HIPAA.  Patient consents to allow me to give results to his daughter.   Daughter verified identifiers via phone.  Daughter updated on labs and imaging results and plan to discharge based on those results.    EDP apologized for daughter being upset   Jull Harral, MD 03/15/23 0045

## 2023-03-15 NOTE — ED Notes (Signed)
Pt daughter called and requested to be updated on the pt's results. Pt gave permission for MD to tell the daughter his results. MD updated pt daughter via phone.

## 2023-03-23 ENCOUNTER — Telehealth: Payer: Self-pay | Admitting: Internal Medicine

## 2023-03-23 ENCOUNTER — Other Ambulatory Visit: Payer: Self-pay

## 2023-03-23 DIAGNOSIS — R053 Chronic cough: Secondary | ICD-10-CM

## 2023-03-23 DIAGNOSIS — Z720 Tobacco use: Secondary | ICD-10-CM

## 2023-03-23 MED ORDER — ESOMEPRAZOLE MAGNESIUM 40 MG PO CPDR: 40.0000 mg | DELAYED_RELEASE_CAPSULE | Freq: Every day | ORAL | 5 refills | Status: DC

## 2023-03-23 NOTE — Progress Notes (Signed)
Printed, signed, place in scan file

## 2023-03-23 NOTE — Telephone Encounter (Signed)
He was supposed to start 8 week trial of nexium and follow up after this. I can order some PFT and see him back once those are done after his 8 week nexium trial. Smoking is likely also contributing to his coughing.

## 2023-03-23 NOTE — Telephone Encounter (Signed)
[place] daughter checking on results of labwork. [place] phone number is [phone].

## 2023-03-23 NOTE — Telephone Encounter (Signed)
Spoke with daughter who is on DPR in regards to message. She was under the impression that the patient was waiting on results form [place]. After looking in chart I see he had labs done at hospital and that they discussed those with her. She asked about a plan to see you sooner since he was in ED. He was told possible collapsed lung vs pneumonia. His cough is getting worse and this am he had yellow sputum. The ED did not give him any medications to take. Please advise. He has FU with you in February as of now.

## 2023-03-23 NOTE — Progress Notes (Signed)
This rx needs to [facility] to [facility] to be filled. The fax number is [phone]

## 2023-03-23 NOTE — Telephone Encounter (Signed)
Nexium needs to be fax'd to [facility]:  Fax is [phone]

## 2023-03-24 NOTE — Telephone Encounter (Signed)
Lm x1 for patient.   Nexium was faxed yesterday according to chart.

## 2023-03-26 ENCOUNTER — Encounter: Payer: Self-pay | Admitting: *Deleted

## 2023-03-26 NOTE — Telephone Encounter (Signed)
Mychart msg to patient informing him nexium rx was faxed and will go ahead and close this encounter.

## 2023-04-02 ENCOUNTER — Ambulatory Visit (HOSPITAL_COMMUNITY): Payer: Medicare (Managed Care) | Attending: Vascular Surgery

## 2023-04-02 ENCOUNTER — Ambulatory Visit (HOSPITAL_COMMUNITY): Payer: Medicare (Managed Care)

## 2023-04-02 ENCOUNTER — Encounter (HOSPITAL_COMMUNITY): Payer: Self-pay

## 2023-04-09 ENCOUNTER — Ambulatory Visit (HOSPITAL_COMMUNITY)
Admission: RE | Admit: 2023-04-09 | Discharge: 2023-04-09 | Disposition: A | Discharge status: Home / Self Care | Payer: Medicare (Managed Care) | Source: Ambulatory Visit | Attending: Vascular Surgery | Admitting: Vascular Surgery

## 2023-04-09 ENCOUNTER — Ambulatory Visit (INDEPENDENT_AMBULATORY_CARE_PROVIDER_SITE_OTHER)
Admission: RE | Admit: 2023-04-09 | Discharge: 2023-04-09 | Disposition: A | Discharge status: Home / Self Care | Payer: Medicare (Managed Care) | Source: Ambulatory Visit | Attending: Vascular Surgery | Admitting: Vascular Surgery

## 2023-04-09 DIAGNOSIS — I70235 Atherosclerosis of native arteries of right leg with ulceration of other part of foot: Secondary | ICD-10-CM | POA: Insufficient documentation

## 2023-04-09 LAB — VAS US ABI WITH/WO TBI: Right ABI: 0.77

## 2023-04-15 ENCOUNTER — Ambulatory Visit (INDEPENDENT_AMBULATORY_CARE_PROVIDER_SITE_OTHER): Payer: Medicare (Managed Care) | Admitting: Physician Assistant

## 2023-04-15 VITALS — BP 151/79 | HR 92 | Temp 98.1°F | Resp 22 | Wt 150.0 lb

## 2023-04-15 DIAGNOSIS — I739 Peripheral vascular disease, unspecified: Secondary | ICD-10-CM

## 2023-04-15 NOTE — Progress Notes (Signed)
 HISTORY AND PHYSICAL     CC:  follow up. Requesting Provider:  [employer], [employer]*  HPI: This is a 78 y.o. male who is here today for follow up for PAD.  Pt has hx of  right common femoral to tibioperoneal trunk bypass with 6mm PTFE in subfascial tunnel by Dr.Hawken on 11/04/2022.  This was done for critical limb ischemia with second toe tip ulceration.  Postoperatively he had some issues with his below-knee incision, but this did heal.  He has hx of left AKA.  Pt was last seen 01/05/2023 and at that time, he was doing well and his wounds were healed.   The pt returns today for follow up.  He states that he does not have any new wounds.  He states he has not been wearing his compression sock and has some swelling in the right leg.  He states that he has had swelling like this in the past when he was instructed to wear his compression.   He states he did have a wound on the anterior part of the leg but it healed.  He denies any pain in his foot at night. He continues to smoke.  He is compliant with his asa/statin.    The pt is on a statin for cholesterol management.    The pt is on an aspirin .    Other AC:  none The pt is on CCB, ACEI for hypertension.  The pt is not on medication for diabetes. Tobacco hx:  current   Past Medical History:  Diagnosis Date   Arthritis    Cancer (HCC)    prostrate   History of kidney stones    Passed   Hypertension    Peripheral artery disease (HCC)    Stool incontinence     Past Surgical History:  Procedure Laterality Date   ABDOMINAL AORTOGRAM W/LOWER EXTREMITY N/A 10/23/2022   Procedure: ABDOMINAL AORTOGRAM W/LOWER EXTREMITY;  Surgeon: Magda Debby SAILOR, MD;  Location: [facility];  Service: Cardiovascular;  Laterality: N/A;   ABOVE KNEE LEG AMPUTATION Left 2023   FEMORAL-POPLITEAL BYPASS GRAFT Right 11/04/2022   Procedure: RIGHT COMMON FEMORAL-TIBIAL PERITONEAL TRUNK ARTERY BYPASS USING 6mm PROPATEN REMOVABLE RING GORETEX GRAFT;   Surgeon: Magda Debby SAILOR, MD;  Location: [facility] OR;  Service: Vascular;  Laterality: Right;    Allergies  Allergen Reactions   Orange Fruit [Citrus] Hives and Itching   Penicillins Hives and Itching    Did it involve swelling of the face/tongue/throat, SOB, or low BP? No Did it involve sudden or severe rash/hives, skin peeling, or any reaction on the inside of your mouth or nose? Yes Did you need to seek medical attention at a hospital or doctor's office? No When did it last happen?  Over 40 years Ago     If all above answers are NO, may proceed with cephalosporin use.     Pineapple Hives and Itching    Current Outpatient Medications  Medication Sig Dispense Refill   amLODipine  (NORVASC ) 5 MG tablet Take 5 mg by mouth daily.     aspirin  81 MG chewable tablet Chew 81 mg by mouth daily.     atorvastatin  (LIPITOR) 40 MG tablet Take 40 mg by mouth daily.     barrier cream (NON-SPECIFIED) CREA Apply 1 Application topically 2 (two) times daily as needed (irritation).     cholecalciferol (VITAMIN D3) 25 MCG (1000 UNIT) tablet Take 1,000 Units by mouth daily.     doxycycline (DORYX)  100 MG EC tablet Take 100 mg by mouth daily.     esomeprazole  (NEXIUM ) 40 MG capsule Take 1 capsule (40 mg total) by mouth daily at 12 noon. 30 capsule 5   lisinopril  (ZESTRIL ) 5 MG tablet Take 5 mg by mouth daily.     Multiple Vitamins-Minerals (MULTIVITAMIN WITH MINERALS) tablet Take 1 tablet by mouth daily. Men 50+     neomycin-bacitracin-polymyxin (NEOSPORIN) OINT Apply 1 Application topically every other day.     No current facility-administered medications for this visit.    Family History  Problem Relation Age of Onset   Lung cancer Neg Hx    Lung disease Neg Hx     Social History   Socioeconomic History   Marital status: Divorced    Spouse name: Not on file   Number of children: Not on file   Years of education: Not on file   Highest education level: Not on file  Occupational History   Not  on file  Tobacco Use   Smoking status: Every Day    Current packs/day: 0.50    Average packs/day: 1 pack/day for 69.0 years (68.0 ttl pk-yrs)    Types: Cigarettes    Start date: 89   Smokeless tobacco: Not on file  Vaping Use   Vaping status: Never Used  Substance and Sexual Activity   Alcohol use: Not Currently    Comment: a beer-very rare   Drug use: Not Currently   Sexual activity: Not on file  Other Topics Concern   Not on file  Social History Narrative   Not on file   Social Drivers of Health   Financial Resource Strain: Not on file  Food Insecurity: No Food Insecurity (11/09/2022)   Hunger Vital Sign    Worried About Running Out of Food in the Last Year: Never true    Ran Out of Food in the Last Year: Never true  Transportation Needs: No Transportation Needs (11/09/2022)   PRAPARE - Administrator, Civil Service (Medical): No    Lack of Transportation (Non-Medical): No  Physical Activity: Not on file  Stress: Not on file  Social Connections: Not on file  Intimate Partner Violence: Not At Risk (11/09/2022)   Humiliation, Afraid, Rape, and Kick questionnaire    Fear of Current or Ex-Partner: No    Emotionally Abused: No    Physically Abused: No    Sexually Abused: No     REVIEW OF SYSTEMS:   [X]  denotes positive finding, [ ]  denotes negative finding Cardiac  Comments:  Chest pain or chest pressure:    Shortness of breath upon exertion:    Short of breath when lying flat:    Irregular heart rhythm:        Vascular    Pain in calf, thigh, or hip brought on by ambulation:    Pain in feet at night that wakes you up from your sleep:     Blood clot in your veins:    Leg swelling:  x       Pulmonary    Oxygen at home:    Productive cough:     Wheezing:         Neurologic    Sudden weakness in arms or legs:     Sudden numbness in arms or legs:     Sudden onset of difficulty speaking or slurred speech:    Temporary loss of vision in one eye:      Problems with dizziness:  Gastrointestinal    Blood in stool:     Vomited blood:         Genitourinary    Burning when urinating:     Blood in urine:        Psychiatric    Major depression:         Hematologic    Bleeding problems:    Problems with blood clotting too easily:        Skin    Rashes or ulcers:        Constitutional    Fever or chills:      PHYSICAL EXAMINATION:  Today's Vitals   04/15/23 1237 04/15/23 1241  BP: (!) 151/79   Pulse: 92   Resp: (!) 22   Temp: 98.1 F (36.7 C)   TempSrc: Temporal   SpO2: 94%   Weight: 150 lb (68 kg)   PainSc: 0-No pain 0-No pain   Body mass index is 24.21 kg/m.   General:  WDWN in NAD; vital signs documented above Gait: Not observed HENT: WNL, normocephalic Pulmonary: normal non-labored breathing , without wheezing Cardiac: regular HR, without carotid bruits Abdomen: soft Skin: without rashes Vascular Exam/Pulses: Unable to palpate bilateral femoral pulses due to body habitus and he is in wheelchair.  It does not lay back.  He has multiphasic right peroneal doppler signal and brisk monophasic DP and PT.   Extremities: there are no wounds present on his foot or leg.  He does have some swelling in the right leg.  Musculoskeletal: no muscle wasting or atrophy  Neurologic: A&O X 3 Psychiatric:  The pt has Normal affect.   Non-Invasive Vascular Imaging:   ABI's/TBI's on 04/09/2023: Right:  0.77/0.88 - Great toe pressure: 144 Left:  AKA  Arterial duplex on 04/09/2023: Right Graft #1:  +------------------+--------+---------------+----------+--------+                   PSV cm/sStenosis       Waveform  Comments  +------------------+--------+---------------+----------+--------+  Inflow           350     50-74% stenosismonophasic          +------------------+--------+---------------+----------+--------+  Prox Anastomosis  66                     monophasic           +------------------+--------+---------------+----------+--------+  Proximal Graft    87                     monophasic          +------------------+--------+---------------+----------+--------+  Mid Graft         79                     monophasic          +------------------+--------+---------------+----------+--------+  Distal Graft      65                     monophasic          +------------------+--------+---------------+----------+--------+  Distal Anastomosis89                     monophasic          +------------------+--------+---------------+----------+--------+  Outflow          49                     monophasic          +------------------+--------+---------------+----------+--------+  Summary:  Right: Patent right lower extremity graft with elevated velocities at the  inflow, suggestive of 50-74% stenosis.     ASSESSMENT/PLAN:: 78 y.o. male here for follow up for PAD with hx of  right common femoral to tibioperoneal trunk bypass with 6mm PTFE in subfascial tunnel by Dr.Hawken on 11/04/2022.  This was done for critical limb ischemia with second toe tip ulceration   -pt with brisk right biphasic peroneal doppler signal and brisk monophasic right DP/PT.  He does have an elevated velocity at the inflow of the bypass.  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  He does not have any non healing wounds.   He does have a healed area on the pre tibial area that has healed.  Discussed angiogram with pt vs bringing him back in 6 weeks to repeat the u/s and see Dr. Magda.  Discussed that if he develops any new wounds or rest pain, he should contact us  sooner.  He does have a spinal cord injury and is confined to wheelchair.   -discussed importance of smoking cessation with pt.   -continue asa/statin   Lucie Apt, [facility] [phone]  Clinic MD:   [place]

## 2023-04-27 ENCOUNTER — Other Ambulatory Visit: Payer: Self-pay

## 2023-04-27 DIAGNOSIS — I739 Peripheral vascular disease, unspecified: Secondary | ICD-10-CM

## 2023-05-20 ENCOUNTER — Ambulatory Visit: Payer: Medicare (Managed Care) | Admitting: Internal Medicine

## 2023-05-20 ENCOUNTER — Encounter: Payer: Self-pay | Admitting: Internal Medicine

## 2023-05-31 NOTE — Progress Notes (Unsigned)
 HISTORY AND PHYSICAL     CC:  follow up. Requesting Provider:  [employer]  HPI: This is a 78 y.o. male who is here today for follow up for PAD.  Pt has hx of  right common femoral to tibioperoneal trunk bypass with 6mm PTFE in subfascial tunnel by Dr.Nalini Alcaraz on 11/04/2022.  This was done for critical limb ischemia with second toe tip ulceration.  Postoperatively he had some issues with his below-knee incision, but this did heal.  He has hx of left AKA.  Pt was last seen 01/05/2023 and at that time, he was doing well and his wounds were healed.   The pt returns today for follow up.  He states that he does not have any new wounds.  He states he has not been wearing his compression sock and has some swelling in the right leg.  He states that he has had swelling like this in the past when he was instructed to wear his compression.   He states he did have a wound on the anterior part of the leg but it healed.  He denies any pain in his foot at night. He continues to smoke.  He is compliant with his asa/statin.    The pt is on a statin for cholesterol management.    The pt is on an aspirin.    Other AC:  none The pt is on CCB, ACEI for hypertension.  The pt is not on medication for diabetes. Tobacco hx:  current  06/02/23: Patient returns to clinic for surveillance after bypass.  He is doing well overall.  His mobility is limited, but this is a longstanding issue.  He reports no new issues in the right foot.  No active ulceration.  No rest pain.  He continues to smoke  Past Medical History:  Diagnosis Date   Arthritis    Cancer (HCC)    prostrate   History of kidney stones    Passed   Hypertension    Peripheral artery disease (HCC)    Stool incontinence     Past Surgical History:  Procedure Laterality Date   ABDOMINAL AORTOGRAM W/LOWER EXTREMITY N/A 10/23/2022   Procedure: ABDOMINAL AORTOGRAM W/LOWER EXTREMITY;  Surgeon: Leonie Douglas, MD;  Location: [facility];   Service: Cardiovascular;  Laterality: N/A;   ABOVE KNEE LEG AMPUTATION Left 2023   FEMORAL-POPLITEAL BYPASS GRAFT Right 11/04/2022   Procedure: RIGHT COMMON FEMORAL-TIBIAL PERITONEAL TRUNK ARTERY BYPASS USING 6mm PROPATEN REMOVABLE RING GORETEX GRAFT;  Surgeon: Leonie Douglas, MD;  Location: [facility] OR;  Service: Vascular;  Laterality: Right;    Allergies  Allergen Reactions   Orange Fruit [Citrus] Hives and Itching   Penicillins Hives and Itching    Did it involve swelling of the face/tongue/throat, SOB, or low BP? No Did it involve sudden or severe rash/hives, skin peeling, or any reaction on the inside of your mouth or nose? Yes Did you need to seek medical attention at a hospital or doctor's office? No When did it last happen?  Over 40 years Ago     If all above answers are "NO", may proceed with cephalosporin use.     Pineapple Hives and Itching    Current Outpatient Medications  Medication Sig Dispense Refill   amLODipine (NORVASC) 5 MG tablet Take 5 mg by mouth daily.     aspirin 81 MG chewable tablet Chew 81 mg by mouth daily.     atorvastatin (LIPITOR) 40 MG tablet  Take 40 mg by mouth daily.     barrier cream (NON-SPECIFIED) CREA Apply 1 Application topically 2 (two) times daily as needed (irritation).     cholecalciferol (VITAMIN D3) 25 MCG (1000 UNIT) tablet Take 1,000 Units by mouth daily.     doxycycline (DORYX) 100 MG EC tablet Take 100 mg by mouth daily.     esomeprazole (NEXIUM) 40 MG capsule Take 1 capsule (40 mg total) by mouth daily at 12 noon. 30 capsule 5   lisinopril (ZESTRIL) 5 MG tablet Take 5 mg by mouth daily.     Multiple Vitamins-Minerals (MULTIVITAMIN WITH MINERALS) tablet Take 1 tablet by mouth daily. Men 50+     neomycin-bacitracin-polymyxin (NEOSPORIN) OINT Apply 1 Application topically every other day.     No current facility-administered medications for this visit.    Family History  Problem Relation Age of Onset   Lung cancer Neg Hx    Lung  disease Neg Hx     Social History   Socioeconomic History   Marital status: Divorced    Spouse name: Not on file   Number of children: Not on file   Years of education: Not on file   Highest education level: Not on file  Occupational History   Not on file  Tobacco Use   Smoking status: Every Day    Current packs/day: 0.50    Average packs/day: 1 pack/day for 69.2 years (68.1 ttl pk-yrs)    Types: Cigarettes    Start date: 9   Smokeless tobacco: Not on file  Vaping Use   Vaping status: Never Used  Substance and Sexual Activity   Alcohol use: Not Currently    Comment: a beer-very rare   Drug use: Not Currently   Sexual activity: Not on file  Other Topics Concern   Not on file  Social History Narrative   Not on file   Social Drivers of Health   Financial Resource Strain: Not on file  Food Insecurity: No Food Insecurity (11/09/2022)   Hunger Vital Sign    Worried About Running Out of Food in the Last Year: Never true    Ran Out of Food in the Last Year: Never true  Transportation Needs: No Transportation Needs (11/09/2022)   PRAPARE - Administrator, Civil Service (Medical): No    Lack of Transportation (Non-Medical): No  Physical Activity: Not on file  Stress: Not on file  Social Connections: Not on file  Intimate Partner Violence: Not At Risk (11/09/2022)   Humiliation, Afraid, Rape, and Kick questionnaire    Fear of Current or Ex-Partner: No    Emotionally Abused: No    Physically Abused: No    Sexually Abused: No    PHYSICAL EXAMINATION:  Today's Vitals   06/01/23 1336  BP: (!) 127/54  Pulse: 82  Temp: 97.9 F (36.6 C)  TempSrc: Temporal  SpO2: 95%  Weight: 150 lb (68 kg)  Height: 5\' 6"  (1.676 m)  PainSc: 5     Body mass index is 24.21 kg/m.  Chronically ill man in no distress Regular rate and rhythm Unlabored breathing Left above-knee amputation Right lower extremity mildly edematous but warm and well-perfused All incisions of  healed well No ulcers about the toes  Non-Invasive Vascular Imaging:    +-------+-----------+-----------+------------+------------+  ABI/TBIToday's ABIToday's TBIPrevious ABIPrevious TBI  +-------+-----------+-----------+------------+------------+  Right 0.79       0.45       0.77        0.88          +-------+-----------+-----------+------------+------------+  Left AKA   Right lower extremity duplex: Patent right lower extremity graft with elevated velocities at the inflow, suggestive of 50-74% stenosis.   ASSESSMENT/PLAN:: 78 y.o. male here for follow up for PAD with hx of  right common femoral to tibioperoneal trunk bypass with 6mm PTFE in subfascial tunnel on 11/04/2022.  This was done for critical limb ischemia with second toe tip ulceration  Recommend:  Abstinence from all tobacco products. Blood glucose control with goal A1c < 7%. Blood pressure control with goal blood pressure < 140/90 mmHg. Lipid reduction therapy with goal LDL-C <100 mg/dL  Aspirin 81mg  PO QD.  Atorvastatin 40-80mg  PO QD (or other "high intensity" statin therapy).  Follow up with PA in 1 year with ABI  Rande Brunt. Lenell Antu, MD [facility] Phone Number: [phone] 06/02/2023 1:20 PM

## 2023-06-01 ENCOUNTER — Ambulatory Visit (HOSPITAL_COMMUNITY)
Admission: RE | Admit: 2023-06-01 | Discharge: 2023-06-01 | Disposition: A | Discharge status: Home / Self Care | Payer: Medicare (Managed Care) | Source: Ambulatory Visit | Attending: Vascular Surgery | Admitting: Vascular Surgery

## 2023-06-01 ENCOUNTER — Ambulatory Visit (INDEPENDENT_AMBULATORY_CARE_PROVIDER_SITE_OTHER): Payer: Medicare (Managed Care) | Admitting: Vascular Surgery

## 2023-06-01 ENCOUNTER — Encounter: Payer: Self-pay | Admitting: Vascular Surgery

## 2023-06-01 ENCOUNTER — Ambulatory Visit (INDEPENDENT_AMBULATORY_CARE_PROVIDER_SITE_OTHER)
Admission: RE | Admit: 2023-06-01 | Discharge: 2023-06-01 | Disposition: A | Discharge status: Home / Self Care | Payer: Medicare (Managed Care) | Source: Ambulatory Visit | Attending: Vascular Surgery | Admitting: Vascular Surgery

## 2023-06-01 VITALS — BP 127/54 | HR 82 | Temp 97.9°F | Ht 66.0 in | Wt 150.0 lb

## 2023-06-01 DIAGNOSIS — I739 Peripheral vascular disease, unspecified: Secondary | ICD-10-CM

## 2023-06-01 LAB — VAS US ABI WITH/WO TBI: Right ABI: 0.79

## 2023-07-24 ENCOUNTER — Emergency Department (HOSPITAL_COMMUNITY)
Admission: EM | Admit: 2023-07-24 | Discharge: 2023-07-24 | Disposition: A | Discharge status: Home / Self Care | Payer: Medicare (Managed Care) | Attending: Emergency Medicine | Admitting: Emergency Medicine

## 2023-07-24 ENCOUNTER — Other Ambulatory Visit: Payer: Self-pay

## 2023-07-24 ENCOUNTER — Emergency Department (HOSPITAL_COMMUNITY): Payer: Medicare (Managed Care)

## 2023-07-24 DIAGNOSIS — R609 Edema, unspecified: Secondary | ICD-10-CM

## 2023-07-24 DIAGNOSIS — R600 Localized edema: Secondary | ICD-10-CM | POA: Diagnosis not present

## 2023-07-24 DIAGNOSIS — Z7982 Long term (current) use of aspirin: Secondary | ICD-10-CM | POA: Insufficient documentation

## 2023-07-24 DIAGNOSIS — M7989 Other specified soft tissue disorders: Secondary | ICD-10-CM | POA: Diagnosis present

## 2023-07-24 DIAGNOSIS — F17200 Nicotine dependence, unspecified, uncomplicated: Secondary | ICD-10-CM | POA: Insufficient documentation

## 2023-07-24 NOTE — ED Notes (Signed)
 Pt care turned over to [facility] for pt transport home.

## 2023-07-24 NOTE — ED Provider Notes (Incomplete Revision)
 [place] EMERGENCY DEPARTMENT AT [facility] Provider Note   CSN: [id number] Arrival date & time: 07/24/23  1202     History  Chief Complaint  Patient presents with   Joint Swelling    Calem Cocozza is a 78 y.o. male.  78 year old male presents for evaluation.  Patient reports that he was sitting outside on his porch smoking this morning.  Suddenly he felt like his left hand and forearm was swollen.  He decided to come to the ED.  On evaluation, he reports that the swelling is "now much better."  He denies other complaint.  The history is provided by the patient.       Home Medications Prior to Admission medications   Medication Sig Start Date End Date Taking? Authorizing Provider  amLODipine  (NORVASC ) 5 MG tablet Take 5 mg by mouth daily.    [provider]  aspirin  81 MG chewable tablet Chew 81 mg by mouth daily. 08/08/10   [provider]  atorvastatin  (LIPITOR) 40 MG tablet Take 40 mg by mouth daily.    [provider]  barrier cream (NON-SPECIFIED) CREA Apply 1 Application topically 2 (two) times daily as needed (irritation).    [provider]  cholecalciferol (VITAMIN D3) 25 MCG (1000 UNIT) tablet Take 1,000 Units by mouth daily.    [provider]  doxycycline (DORYX) 100 MG EC tablet Take 100 mg by mouth daily.    [provider]  esomeprazole  (NEXIUM ) 40 MG capsule Take 1 capsule (40 mg total) by mouth daily at 12 noon. 03/23/23   Aleck Hurdle, MD  lisinopril  (ZESTRIL ) 5 MG tablet Take 5 mg by mouth daily.    [provider]  Multiple Vitamins-Minerals (MULTIVITAMIN WITH MINERALS) tablet Take 1 tablet by mouth daily. Men 50+    [provider]  neomycin-bacitracin-polymyxin (NEOSPORIN) OINT Apply 1 Application topically every other day.    [provider]      Allergies    Orange fruit [citrus], Penicillins, and Pineapple    Review of Systems   Review of Systems   All other systems reviewed and are negative.   Physical Exam Updated Vital Signs BP (!) 160/65 (BP Location: Right Arm)   Pulse 99   Temp 97.7 F (36.5 C) (Oral)   Resp 18   SpO2 96%  Physical Exam Vitals and nursing note reviewed.  Constitutional:      General: He is not in acute distress.    Appearance: Normal appearance. He is well-developed.  HENT:     Head: Normocephalic and atraumatic.  Eyes:     Conjunctiva/sclera: Conjunctivae normal.     Pupils: Pupils are equal, round, and reactive to light.  Cardiovascular:     Rate and Rhythm: Normal rate and regular rhythm.     Heart sounds: Normal heart sounds.  Pulmonary:     Effort: Pulmonary effort is normal. No respiratory distress.     Breath sounds: Normal breath sounds.  Abdominal:     General: There is no distension.     Palpations: Abdomen is soft.     Tenderness: There is no abdominal tenderness.  Musculoskeletal:        General: No deformity. Normal range of motion.     Cervical back: Normal range of motion and neck supple.     Comments: No appreciable edema of the left upper extremity.  Patient's left lower extremity is neurovascular intact.  Skin:    General: Skin is warm and  dry.  Neurological:     General: No focal deficit present.     Mental Status: He is alert and oriented to person, place, and time.     ED Results / Procedures / Treatments   Labs (all labs ordered are listed, but only abnormal results are displayed) Labs Reviewed - No data to display  EKG None  Radiology No results found.  Procedures Procedures    Medications Ordered in ED Medications - No data to display  ED Course/ Medical Decision Making/ A&P                                 Medical Decision Making Amount and/or Complexity of Data Reviewed Radiology: ordered.    Medical Screen Complete  This patient presented to the ED with complaint of right hand edema (resolved).  This complaint involves an extensive  number of treatment options. The initial differential diagnosis includes, but is not limited to, trauma  This presentation is: Acute, Self-Limited, Previously Undiagnosed, Uncertain Prognosis, and Complicated  Patient is presenting with complaint of transient swelling to his left hand.  He noticed this swelling while he was sitting outside smoking on his porch.  On exam he has no edema or swelling.  He is without any significant acute abnormality of the left upper extremity.  Imaging obtained is without acute abnormality.  Of note, patient declines to wait in the ED for official radiology read.  He is adamant that he is ready to go home.  He has been more than 2 hours since his left hand x-ray was performed.  I have called the radiology reading room several times and asked for expedited read.  Importance of close follow-up is stressed.  Strict return precautions given understood.  Co morbidities that complicated the patient's evaluation  See hpi    Additional history obtained:  External records from outside sources obtained and reviewed including prior ED visits and prior Inpatient records.    Problem List / ED Course:  Transient left hand edema    Disposition:  After consideration of the diagnostic results and the patients response to treatment, I feel that the patent would benefit from close outpatient followup.          Final Clinical Impression(s) / ED Diagnoses Final diagnoses:  Edema, unspecified type    Rx / DC Orders ED Discharge Orders     None         Burnette Carte, MD 07/24/23 [phone]

## 2023-07-24 NOTE — ED Notes (Signed)
 Spoke with patients daughter and updated her regarding patients discharge status. Went over discharge instruction with patient and patients daughter.

## 2023-07-24 NOTE — ED Notes (Addendum)
 No ETA provided for patient transportation back home. Patient and daughter updated at this time. Provided patient with snacks and drinks while he waits. Patients address updated in chart per daughters request.

## 2023-07-24 NOTE — ED Provider Notes (Signed)
 [place] EMERGENCY DEPARTMENT AT [facility] Provider Note   CSN: [id number] Arrival date & time: 07/24/23  1202     History  Chief Complaint  Patient presents with   Joint Swelling    Calem Cocozza is a 78 y.o. male.  78 year old male presents for evaluation.  Patient reports that he was sitting outside on his porch smoking this morning.  Suddenly he felt like his left hand and forearm was swollen.  He decided to come to the ED.  On evaluation, he reports that the swelling is "now much better."  He denies other complaint.  The history is provided by the patient.       Home Medications Prior to Admission medications   Medication Sig Start Date End Date Taking? Authorizing Provider  amLODipine  (NORVASC ) 5 MG tablet Take 5 mg by mouth daily.    [provider]  aspirin  81 MG chewable tablet Chew 81 mg by mouth daily. 08/08/10   [provider]  atorvastatin  (LIPITOR) 40 MG tablet Take 40 mg by mouth daily.    [provider]  barrier cream (NON-SPECIFIED) CREA Apply 1 Application topically 2 (two) times daily as needed (irritation).    [provider]  cholecalciferol (VITAMIN D3) 25 MCG (1000 UNIT) tablet Take 1,000 Units by mouth daily.    [provider]  doxycycline (DORYX) 100 MG EC tablet Take 100 mg by mouth daily.    [provider]  esomeprazole  (NEXIUM ) 40 MG capsule Take 1 capsule (40 mg total) by mouth daily at 12 noon. 03/23/23   Aleck Hurdle, MD  lisinopril  (ZESTRIL ) 5 MG tablet Take 5 mg by mouth daily.    [provider]  Multiple Vitamins-Minerals (MULTIVITAMIN WITH MINERALS) tablet Take 1 tablet by mouth daily. Men 50+    [provider]  neomycin-bacitracin-polymyxin (NEOSPORIN) OINT Apply 1 Application topically every other day.    [provider]      Allergies    Orange fruit [citrus], Penicillins, and Pineapple    Review of Systems   Review of Systems   All other systems reviewed and are negative.   Physical Exam Updated Vital Signs BP (!) 160/65 (BP Location: Right Arm)   Pulse 99   Temp 97.7 F (36.5 C) (Oral)   Resp 18   SpO2 96%  Physical Exam Vitals and nursing note reviewed.  Constitutional:      General: He is not in acute distress.    Appearance: Normal appearance. He is well-developed.  HENT:     Head: Normocephalic and atraumatic.  Eyes:     Conjunctiva/sclera: Conjunctivae normal.     Pupils: Pupils are equal, round, and reactive to light.  Cardiovascular:     Rate and Rhythm: Normal rate and regular rhythm.     Heart sounds: Normal heart sounds.  Pulmonary:     Effort: Pulmonary effort is normal. No respiratory distress.     Breath sounds: Normal breath sounds.  Abdominal:     General: There is no distension.     Palpations: Abdomen is soft.     Tenderness: There is no abdominal tenderness.  Musculoskeletal:        General: No deformity. Normal range of motion.     Cervical back: Normal range of motion and neck supple.     Comments: No appreciable edema of the left upper extremity.  Patient's left lower extremity is neurovascular intact.  Skin:    General: Skin is warm and  dry.  Neurological:     General: No focal deficit present.     Mental Status: He is alert and oriented to person, place, and time.     ED Results / Procedures / Treatments   Labs (all labs ordered are listed, but only abnormal results are displayed) Labs Reviewed - No data to display  EKG None  Radiology No results found.  Procedures Procedures    Medications Ordered in ED Medications - No data to display  ED Course/ Medical Decision Making/ A&P                                 Medical Decision Making Amount and/or Complexity of Data Reviewed Radiology: ordered.    Medical Screen Complete  This patient presented to the ED with complaint of right hand edema (resolved).  This complaint involves an extensive  number of treatment options. The initial differential diagnosis includes, but is not limited to, trauma  This presentation is: Acute, Self-Limited, Previously Undiagnosed, Uncertain Prognosis, and Complicated  Patient is presenting with complaint of transient swelling to his left hand.  He noticed this swelling while he was sitting outside smoking on his porch.  On exam he has no edema or swelling.  He is without any significant acute abnormality of the left upper extremity.  Imaging obtained is without acute abnormality.  Of note, patient declines to wait in the ED for official radiology read.  He is adamant that he is ready to go home.  He has been more than 2 hours since his left hand x-ray was performed.  I have called the radiology reading room several times and asked for expedited read.  Importance of close follow-up is stressed.  Strict return precautions given understood.  Co morbidities that complicated the patient's evaluation  See hpi    Additional history obtained:  External records from outside sources obtained and reviewed including prior ED visits and prior Inpatient records.    Problem List / ED Course:  Transient left hand edema    Disposition:  After consideration of the diagnostic results and the patients response to treatment, I feel that the patent would benefit from close outpatient followup.          Final Clinical Impression(s) / ED Diagnoses Final diagnoses:  Edema, unspecified type    Rx / DC Orders ED Discharge Orders     None         Burnette Carte, MD 07/24/23 [phone]

## 2023-07-24 NOTE — ED Triage Notes (Signed)
 Pt presents to the ED via EMS from home with complaints of sudden left arm swelling. Pt states he did not wake up with the swelling. He was on his porch smoking and then suddenly felt his arm swelling. Pt states it feels tight, unsure if a bug bit him. A[facility] EMS VS:  BP: 170/100 HR:80 BGL 163

## 2023-07-24 NOTE — Discharge Instructions (Addendum)
 Return for any problem.  ?

## 2023-07-24 NOTE — ED Notes (Signed)
 EMS/Police contacted for pt transportation back home.

## 2023-10-01 ENCOUNTER — Ambulatory Visit (INDEPENDENT_AMBULATORY_CARE_PROVIDER_SITE_OTHER): Payer: Medicare (Managed Care) | Admitting: Podiatry

## 2023-10-01 DIAGNOSIS — L989 Disorder of the skin and subcutaneous tissue, unspecified: Secondary | ICD-10-CM

## 2023-10-01 NOTE — Progress Notes (Signed)
 Subjective:  Patient ID: Steven Dollie Seip Sr., male    DOB: 1945-05-28,  MRN: [id number]  Chief Complaint  Patient presents with   Callouses    78 y.o. male presents with the above complaint. Patient presents with left submetatarsal 5 benign skin lesion painful to touch is progressive and worse worse with ambulation is with pressure he would like to have it treated debrided down he states is been bothersome he has had it removed before.  Denies any other acute complaints   Review of Systems: Negative except as noted in the HPI. Denies N/V/F/Ch.  Past Medical History:  Diagnosis Date   Arthritis    Cancer (HCC)    prostrate   History of kidney stones    Passed   Hypertension    Peripheral artery disease (HCC)    Stool incontinence     Current Outpatient Medications:    amLODipine  (NORVASC ) 5 MG tablet, Take 5 mg by mouth daily., Disp: , Rfl:    aspirin  81 MG chewable tablet, Chew 81 mg by mouth daily., Disp: , Rfl:    atorvastatin  (LIPITOR) 40 MG tablet, Take 40 mg by mouth daily., Disp: , Rfl:    barrier cream (NON-SPECIFIED) CREA, Apply 1 Application topically 2 (two) times daily as needed (irritation)., Disp: , Rfl:    cholecalciferol (VITAMIN D3) 25 MCG (1000 UNIT) tablet, Take 1,000 Units by mouth daily., Disp: , Rfl:    doxycycline (DORYX) 100 MG EC tablet, Take 100 mg by mouth daily., Disp: , Rfl:    esomeprazole  (NEXIUM ) 40 MG capsule, Take 1 capsule (40 mg total) by mouth daily at 12 noon., Disp: 30 capsule, Rfl: 5   lisinopril  (ZESTRIL ) 5 MG tablet, Take 5 mg by mouth daily., Disp: , Rfl:    Multiple Vitamins-Minerals (MULTIVITAMIN WITH MINERALS) tablet, Take 1 tablet by mouth daily. Men 50+, Disp: , Rfl:    neomycin-bacitracin-polymyxin (NEOSPORIN) OINT, Apply 1 Application topically every other day., Disp: , Rfl:   Social History   Tobacco Use  Smoking Status Every Day   Current packs/day: 0.50   Average packs/day: 1 pack/day for 69.5 years (68.2 ttl pk-yrs)    Types: Cigarettes   Start date: 1956  Smokeless Tobacco Not on file    Allergies  Allergen Reactions   Orange Fruit [Citrus] Hives and Itching   Penicillins Hives and Itching    Did it involve swelling of the face/tongue/throat, SOB, or low BP? No Did it involve sudden or severe rash/hives, skin peeling, or any reaction on the inside of your mouth or nose? Yes Did you need to seek medical attention at a hospital or doctor's office? No When did it last happen?  Over 40 years Ago     If all above answers are NO, may proceed with cephalosporin use.     Pineapple Hives and Itching   Objective:  There were no vitals filed for this visit. There is no height or weight on file to calculate BMI. Constitutional Well developed. Well nourished.  Vascular Dorsalis pedis pulses palpable bilaterally. Posterior tibial pulses palpable bilaterally. Capillary refill normal to all digits.  No cyanosis or clubbing noted. Pedal hair growth normal.  Neurologic Normal speech. Oriented to person, place, and time. Epicritic sensation to light touch grossly present bilaterally.  Dermatologic Left submet 5 porokeratotic lesion with central nucleated core noted mild pain on palpation.  Orthopedic: Normal joint ROM without pain or crepitus bilaterally. No visible deformities. No bony tenderness.   Radiographs: None Assessment:  1. Benign skin lesion    Plan:  Patient was evaluated and treated and all questions answered.  Left submetatarsal 5 porokeratosis/benign skin lesion - All questions and concerns were discussed with the patient extensive detail given the amount of lesion is present using chisel blade and the lesion was debrided to healthy tissue no complication noted no pinpoint bleeding noted  No follow-ups on file.

## 2023-10-05 ENCOUNTER — Encounter (HOSPITAL_COMMUNITY): Payer: Medicare (Managed Care)

## 2023-10-05 ENCOUNTER — Other Ambulatory Visit (HOSPITAL_COMMUNITY): Payer: Medicare (Managed Care)

## 2023-10-05 ENCOUNTER — Ambulatory Visit: Payer: Medicare (Managed Care)

## 2024-01-19 ENCOUNTER — Ambulatory Visit: Payer: Medicare (Managed Care) | Admitting: Internal Medicine

## 2024-02-16 ENCOUNTER — Ambulatory Visit: Payer: Medicare (Managed Care) | Admitting: Internal Medicine

## 2024-02-16 ENCOUNTER — Other Ambulatory Visit: Payer: Self-pay | Admitting: Nurse Practitioner

## 2024-02-16 ENCOUNTER — Encounter: Payer: Self-pay | Admitting: Internal Medicine

## 2024-02-16 VITALS — BP 148/80 | HR 94 | Temp 97.8°F | Ht 66.0 in | Wt 150.8 lb

## 2024-02-16 DIAGNOSIS — R053 Chronic cough: Secondary | ICD-10-CM

## 2024-02-16 DIAGNOSIS — Z122 Encounter for screening for malignant neoplasm of respiratory organs: Secondary | ICD-10-CM

## 2024-02-16 DIAGNOSIS — F17210 Nicotine dependence, cigarettes, uncomplicated: Secondary | ICD-10-CM

## 2024-02-16 DIAGNOSIS — Z0000 Encounter for general adult medical examination without abnormal findings: Secondary | ICD-10-CM

## 2024-02-16 DIAGNOSIS — F17200 Nicotine dependence, unspecified, uncomplicated: Secondary | ICD-10-CM

## 2024-02-16 MED ORDER — ALBUTEROL SULFATE HFA 108 (90 BASE) MCG/ACT IN AERS: 2.0000 | INHALATION_SPRAY | Freq: Four times a day (QID) | RESPIRATORY_TRACT | 6 refills | Status: AC | PRN

## 2024-02-16 NOTE — Progress Notes (Signed)
 Steven Rucinski Sr.    [id number]    1945/12/08  Primary Care Physician:Reed, Annabella CROME, DO Date of Appointment: 02/16/2024 Established Patient Visit  Chief complaint:   Chief Complaint  Patient presents with   Cough    Overdue follow up     HPI: Steven Ferrick Sr. is a 78 y.o. man with tobacco use disorder and chronic cough. [facility]   Interval Updates: Here for follow up after about a year. At last visit was given nexium  for cough suspected due to reflux, and we discussed smoking cessation to help with this as well.   He has cut back on smoking and now smokes 0.3 pack per day. Feels the coughing has improved.  He denies reflux and did not start the nexium .   Otherwise no interval bronchitis or pneumonia.  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   Has occasional mucus production. Feels he is sleeping ok and no dyspnea or coughing in his sleep.   He lives at home independently. He has a caretaker who comes daily to help with ADLs.   We discussed smoking cessation and he has tried quitting 4 times before and says everytime he quits he gets sick and goes to the hospital.   Currently has an albuterol inhaler at home which he almost never uses unless he is sick. This happens less than once/year.     Past Medical History:  Diagnosis Date   Arthritis    Cancer (HCC)    prostrate   History of kidney stones    Passed   Hypertension    Peripheral artery disease    Stool incontinence     Past Surgical History:  Procedure Laterality Date   ABDOMINAL AORTOGRAM W/LOWER EXTREMITY N/A 10/23/2022   Procedure: ABDOMINAL AORTOGRAM W/LOWER EXTREMITY;  Surgeon: Magda Debby SAILOR, MD;  Location: [facility] INVASIVE CV LAB;  Service: Cardiovascular;  Laterality: N/A;   ABOVE KNEE LEG AMPUTATION Left 2023   FEMORAL-POPLITEAL BYPASS GRAFT Right 11/04/2022   Procedure: RIGHT COMMON FEMORAL-TIBIAL  PERITONEAL TRUNK ARTERY BYPASS USING 6mm PROPATEN REMOVABLE RING GORETEX GRAFT;  Surgeon: Magda Debby SAILOR, MD;  Location: [facility] OR;  Service: Vascular;  Laterality: Right;    Family History  Problem Relation Age of Onset   Lung cancer Neg Hx    Lung disease Neg Hx     Social History   Occupational History   Not on file  Tobacco Use   Smoking status: Every Day    Current packs/day: 0.50    Average packs/day: 1 pack/day for 69.9 years (68.4 ttl pk-yrs)    Types: Cigarettes    Start date: 1956   Smokeless tobacco: Not on file   Tobacco comments:    Takes pt 2 days to smoke a pack  02/16/2024  Vaping Use   Vaping status: Never Used  Substance and Sexual Activity   Alcohol use: Not Currently    Comment: a beer-very rare   Drug use: Not Currently   Sexual activity: Not on file     Physical Exam: Blood pressure (!) 148/80, pulse 94, temperature 97.8 F (36.6 C), temperature source Oral, height 5' 6 (1.676 m), weight 150 lb 12.8 oz (68.4 kg), SpO2 96%.  Gen:      No acute distress, in motorized wheelchair, left AKA ENT:  no nasal polyps, mucus membranes moist Lungs:  No increased respiratory effort, symmetric chest wall excursion, clear to auscultation bilaterally, no wheezes or crackles CV:         Regular rate and rhythm; no murmurs, rubs, or gallops.  No pedal edema   Data Reviewed: Imaging: I have personally reviewed the ctpe dec 2024 negative for PE, shows emphysema, bibasilar atelectasis  PFTs:    Labs: Lab Results  Component Value Date   NA 133 (L) 03/14/2023   K 3.8 03/14/2023   CO2 23 03/14/2023   GLUCOSE 92 03/14/2023   BUN 14 03/14/2023   CREATININE 0.85 03/14/2023   CALCIUM  8.8 (L) 03/14/2023   GFRNONAA >60 03/14/2023   Lab Results  Component Value Date   WBC 7.1 03/14/2023   HGB 12.4 (L) 03/14/2023   HCT 36.8 (L) 03/14/2023   MCV 85.8 03/14/2023   PLT 198 03/14/2023    Immunization status: Immunization History  Administered Date(s)  Administered   Fluad Trivalent(High Dose 65+) 01/15/2024   Fluzone Influenza virus vaccine,trivalent (IIV3), split virus 04/18/2012, 04/11/2013   INFLUENZA, HIGH DOSE SEASONAL PF 01/23/2015, 03/18/2016   Influenza,inj,Quad PF,6+ Mos 04/12/2014   Influenza-Unspecified 01/04/2022, 01/20/2022   Pneumococcal Conjugate-13 07/31/2014   Pneumococcal Polysaccharide-23 08/08/2010   Tdap 08/05/2010   Unspecified SARS-COV-2 Vaccination 02/13/2022     Assessment:  Tobacco use disorder Chronic Cough Screening for lung cancer  Plan: I am glad your coughing is better.  I suspect cutting back on smoking has helped improve your cough.  I have refilled your albuterol inhaler.  This is just to use in case you develop shortness of breath, chest congestion, wheezing.  You mention you normally need this when you are sick about once a year.  Your primary care has ordered a CT scan for lung cancer screening.  I agree with this.  If there is anything abnormal on the scan please come back and see our office.  Smoking Cessation Counseling:  1. The patient is an everyday smoker and symptomatic due to the following condition chronic cough 2. The patient is currently pre-contemplative in quitting smoking. 3. I advised patient to quit smoking. 4. We identified patient specific barriers to change.  5. I personally spent 3 minutes counseling the patient regarding tobacco use disorder. 6. We discussed management of stress and anxiety to help with smoking cessation, when applicable. 7. We discussed nicotine replacement therapy, Wellbutrin, Chantix as possible options. 8. I advised setting a quit date. 9. Follow?up arranged with our office to continue ongoing discussions. 10.Resources given to patient including quit hotline.    Return to Care: Return if symptoms worsen or fail to improve.   Verdon Gore, MD Pulmonary and Critical Care Medicine [facility] [phone]

## 2024-02-16 NOTE — Patient Instructions (Addendum)
 It was a pleasure to see you today!  Please schedule follow up as needed.   Please call [phone] if issues or concerns arise. You can also send us  a message through MyChart, but but aware that this is not to be used for urgent issues and it may take up to 5-7 days to receive a reply. Please be aware that you will likely be able to view your results before I have a chance to respond to them. Please give us  5 business days to respond to any non-urgent results.   I am glad your coughing is better.  I suspect cutting back on smoking has helped improve your cough.  I have refilled your albuterol inhaler.  This is just to use in case you develop shortness of breath, chest congestion, wheezing.  You mention you normally need this when you are sick about once a year.  Your primary care has ordered a CT scan for lung cancer screening.  I agree with this.  If there is anything abnormal on the scan please come back and see our office.  What are the benefits of quitting smoking? Quitting smoking can lower your chances of getting or dying from heart disease, lung disease, kidney failure, infection, or cancer. It can also lower your chances of getting osteoporosis, a condition that makes your bones weak. Plus, quitting smoking can help your skin look younger and reduce the chances that you will have problems with sex.  Quitting smoking will improve your health no matter how old you are, and no matter how long or how much you have smoked.  What should I do if I want to quit smoking? The letters in the word START can help you remember the steps to take: S = Set a quit date. T = Tell family, friends, and the people around you that you plan to quit. A = Anticipate or plan ahead for the tough times you'll face while quitting. R = Remove cigarettes and other tobacco products from your home, car, and work. T = Talk to your doctor about getting help to quit.  How can my doctor or nurse help? Your doctor or  nurse can give you advice on the best way to quit. He or she can also put you in touch with counselors or other people you can call for support. Plus, your doctor or nurse can give you medicines to: ?Reduce your craving for cigarettes ?Reduce the unpleasant symptoms that happen when you stop smoking (called withdrawal symptoms). You can also get help from a free phone line (1-800-QUIT-NOW) or go online to mechanicalarm.dk.  What are the symptoms of withdrawal? The symptoms include: ?Trouble sleeping ?Being irritable, anxious or restless ?Getting frustrated or angry ?Having trouble thinking clearly  Some people who stop smoking become temporarily depressed. Some people need treatment for depression, such as counseling or antidepressant medicines. Depressed people might: ?No longer enjoy or care about doing the things they used to like to do ?Feel sad, down, hopeless, nervous, or cranky most of the day, almost every day ?Lose or gain weight ?Sleep too much or too little ?Feel tired or like they have no energy ?Feel guilty or like they are worth nothing ?Forget things or feel confused ?Move and speak more slowly than usual ?Act restless or have trouble staying still ?Think about death or suicide  If you think you might be depressed, see your doctor or nurse. Only someone trained in mental health can tell for sure if you are  depressed. If you ever feel like you might hurt yourself, go straight to the nearest emergency department. Or you can call for an ambulance (in the [place]  and [place], dial 9-1-1) or call your doctor or nurse right away and tell them it is an emergency. You can also reach the [place]  National Suicide Prevention Lifeline at (604)531-0137 or http://hill.com/.  How do medicines help you stop smoking? Different medicines work in different ways: ?Nicotine replacement therapy eases withdrawal and reduces your body's craving for nicotine, the main drug found in  cigarettes. There are different forms of nicotine replacement, including skin patches, lozenges, gum, nasal sprays, and puffers or inhalers. Many can be bought without a prescription, while others might require one. ?Bupropion is a prescription medicine that reduces your desire to smoke. This medicine is sold under the brand names Zyban and Wellbutrin. It is also available in a generic version, which is cheaper than brand name medicines. ?Varenicline (brand names: Chantix, Champix) is a prescription medicine that reduces withdrawal symptoms and cigarette cravings. If you think you'd like to take varenicline and you have a history of depression, anxiety, or heart disease, discuss this with your doctor or nurse before taking the medicine. Varenicline can also increase the effects of alcohol in some people. It's a good idea to limit drinking while you're taking it, at least until you know how it affects you.  How does counseling work? Counseling can happen during formal office visits or just over the phone. A counselor can help you: ?Figure out what triggers your smoking and what to do instead ?Overcome cravings ?Figure out what went wrong when you tried to quit before  What works best? Studies show that people have the best luck at quitting if they take medicines to help them quit and work with a veterinary surgeon. It might also be helpful to combine nicotine replacement with one of the prescription medicines that help people quit. In some cases, it might even make sense to take bupropion and varenicline together.  What about e-cigarettes? Sometimes people wonder if using electronic cigarettes, or e-cigarettes, might help them quit smoking. Using e-cigarettes is also called vaping. Doctors do not recommend e-cigarettes in place of medicines and counseling. That's because e-cigarettes still contain nicotine as well as other substances that might be harmful. It's not clear how they can affect a person's  health in the long term.  Will I gain weight if I quit? Yes, you might gain a few pounds. But quitting smoking will have a much more positive effect on your health than weighing a few pounds more. Plus, you can help prevent some weight gain by being more active and eating less. Taking the medicine bupropion might help control weight gain.   What else can I do to improve my chances of quitting? You can: ?Start exercising. ?Stay away from smokers and places that you associate with smoking. If people close to you smoke, ask them to quit with you. ?Keep gum, hard candy, or something to put in your mouth handy. If you get a craving for a cigarette, try one of these instead. ?Don't give up, even if you start smoking again. It takes most people a few tries before they succeed.  What if I am pregnant and I smoke? If you are pregnant, it's really important for the health of your baby that you quit. Ask your doctor what options you have, and what is safest for your baby

## 2024-04-13 ENCOUNTER — Inpatient Hospital Stay: Admission: RE | Admit: 2024-04-13 | Payer: Medicare (Managed Care) | Source: Ambulatory Visit

## 2024-05-05 ENCOUNTER — Ambulatory Visit
Admission: RE | Admit: 2024-05-05 | Discharge: 2024-05-05 | Disposition: A | Payer: Medicare (Managed Care) | Source: Ambulatory Visit | Attending: Nurse Practitioner | Admitting: Nurse Practitioner

## 2024-05-05 DIAGNOSIS — Z0000 Encounter for general adult medical examination without abnormal findings: Secondary | ICD-10-CM

## 2024-05-10 ENCOUNTER — Other Ambulatory Visit: Payer: Self-pay

## 2024-05-10 DIAGNOSIS — I70235 Atherosclerosis of native arteries of right leg with ulceration of other part of foot: Secondary | ICD-10-CM

## 2024-06-06 ENCOUNTER — Ambulatory Visit (HOSPITAL_COMMUNITY): Payer: Medicare (Managed Care)

## 2024-06-06 ENCOUNTER — Ambulatory Visit: Payer: Medicare (Managed Care)
